# Patient Record
Sex: Female | Born: 2000 | Race: Black or African American | Hispanic: No | Smoking: Never smoker
Health system: Southern US, Community
[De-identification: ages and names within clinical notes are randomized; demographics above are authoritative.]

## PROBLEM LIST (undated history)

## (undated) DIAGNOSIS — G373 Acute transverse myelitis in demyelinating disease of central nervous system: Secondary | ICD-10-CM

## (undated) HISTORY — PX: HERNIA REPAIR: SHX51

---
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
## 2022-01-09 ENCOUNTER — Encounter: Payer: Self-pay | Admitting: Nurse Practitioner

## 2022-01-09 ENCOUNTER — Ambulatory Visit (INDEPENDENT_AMBULATORY_CARE_PROVIDER_SITE_OTHER): Payer: Medicaid Other | Admitting: Nurse Practitioner

## 2022-01-09 VITALS — BP 108/70 | HR 91 | Temp 98.0°F | Ht 69.75 in | Wt 136.6 lb

## 2022-01-09 DIAGNOSIS — Z3200 Encounter for pregnancy test, result unknown: Secondary | ICD-10-CM | POA: Diagnosis not present

## 2022-01-09 DIAGNOSIS — Z1159 Encounter for screening for other viral diseases: Secondary | ICD-10-CM

## 2022-01-09 DIAGNOSIS — N898 Other specified noninflammatory disorders of vagina: Secondary | ICD-10-CM | POA: Diagnosis not present

## 2022-01-09 DIAGNOSIS — Z136 Encounter for screening for cardiovascular disorders: Secondary | ICD-10-CM

## 2022-01-09 DIAGNOSIS — Z13220 Encounter for screening for lipoid disorders: Secondary | ICD-10-CM | POA: Diagnosis not present

## 2022-01-09 DIAGNOSIS — Z113 Encounter for screening for infections with a predominantly sexual mode of transmission: Secondary | ICD-10-CM | POA: Insufficient documentation

## 2022-01-09 DIAGNOSIS — Z7689 Persons encountering health services in other specified circumstances: Secondary | ICD-10-CM

## 2022-01-09 LAB — PREGNANCY, URINE: Preg Test, Ur: NEGATIVE

## 2022-01-09 LAB — WET PREP FOR TRICH, YEAST, CLUE
Clue Cell Exam: POSITIVE — AB
Trichomonas Exam: NEGATIVE
Yeast Exam: NEGATIVE

## 2022-01-09 LAB — URINALYSIS, ROUTINE W REFLEX MICROSCOPIC
Bilirubin, UA: NEGATIVE
Glucose, UA: NEGATIVE
Leukocytes,UA: NEGATIVE
Nitrite, UA: NEGATIVE
Protein,UA: NEGATIVE
Specific Gravity, UA: >1.03 — ABNORMAL HIGH (ref 1.005–1.030)
Urobilinogen, Ur: 0.2 mg/dL (ref 0.2–1.0)
pH, UA: 5.5 (ref 5.0–7.5)

## 2022-01-09 LAB — MICROSCOPIC EXAMINATION: WBC, UA: NONE SEEN /hpf (ref 0–5)

## 2022-01-09 MED ORDER — METRONIDAZOLE 500 MG PO TABS: 500.0000 mg | ORAL_TABLET | Freq: Two times a day (BID) | ORAL | 0 refills | Status: AC

## 2022-01-09 NOTE — Progress Notes (Signed)
New Patient Office Visit  Subjective    Patient ID: Brandi [name], female    DOB: Feb 27, 2001  Age: 21 y.o. MRN: [id number]  CC:  Chief Complaint  Patient presents with   Establish Care   Menstrual Problem    Patient states she was 13 days late for her menstrual cycle, started spotting yesterday but states it is very light. Patient states she had vaginal discharge with an odor, but that has stopped. Patient states there is a possibility she may be pregnant and would like screening for STI's done.     HPI Brandi [name] presents for new patient visit to establish care.  Introduced to Designer, jewellery role and practice setting.  All questions answered.  Discussed provider/patient relationship and expectations.  STD SCREENING & POSSIBLE PREGNANCY Currently sexually active with one partner - has been with them on and off.  No current birth control, including condoms.  LMP 11/24/21.  Is currently 13 days late, but did have some recent spotting.  She is a G 0 P 0.   Sexual activity:  In a Monogamous Relationship Contraception: no Recent unprotected intercourse: yes History of sexually transmitted diseases: yes -- chlamydia Previous sexually transmitted disease screening: yes Lifetime sexual partners: 3 Genital lesions: no Dysuria: no Swollen lymph nodes: no Fevers: no Rash: no  PREGNANCY POSSIBLE In past used Depo and did not like loss of appetite and then tried birth control and this made her feel sad. Gravida/Para: 0/0 Home pregnancy test: negative x1 Nausea: yes Vomiting: no Breast tenderness: no Abdominal pain: mild cramps Vaginal bleeding:  spotting Pregnancy or labor complications: no Fetal abnormalities: no Contraception: none  Outpatient Encounter Medications as of 01/09/2022  Medication Sig   metroNIDAZOLE (FLAGYL) 500 MG tablet Take 1 tablet (500 mg total) by mouth 2 (two) times daily for 7 days.   [DISCONTINUED] benzonatate (TESSALON PERLES)  100 MG capsule Take 1-2 tabs TID prn cough (Patient not taking: Reported on 01/09/2022)   [DISCONTINUED] brompheniramine-pseudoephedrine-DM 30-2-10 MG/5ML syrup Take 5 mLs by mouth 4 (four) times daily as needed. (Patient not taking: Reported on 01/09/2022)   [DISCONTINUED] ondansetron (ZOFRAN-ODT) 4 MG disintegrating tablet Take 1 tablet (4 mg total) by mouth every 8 (eight) hours as needed for nausea or vomiting. (Patient not taking: Reported on 01/09/2022)   No facility-administered encounter medications on file as of 01/09/2022.    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 No pertinent family history.  Social History   Socioeconomic History   Marital status: Single    Spouse name: Not on file   Number of children: Not on file   Years of education: Not on file   Highest education level: Not on file  Occupational History   Not on file  Tobacco Use   Smoking status: Never   Smokeless tobacco: Never  Vaping Use   Vaping Use: Former  Substance and Sexual Activity   Alcohol use: Yes    Comment: special ocassions   Drug use: Yes    Types: Marijuana   Sexual activity: Yes  Other Topics Concern   Not on file  Social History Narrative   Not on file   Social Determinants of Health   Financial Resource Strain: Low Risk  (01/09/2022)   Overall Financial Resource Strain (CARDIA)    Difficulty of Paying Living  Expenses: Not hard at all  Food Insecurity: No Food Insecurity (01/09/2022)   Hunger Vital Sign    Worried About Running Out of Food in the Last Year: Never true    Ran Out of Food in the Last Year: Never true  Transportation Needs: No Transportation Needs (01/09/2022)   PRAPARE - Administrator, Civil Service (Medical): No    Lack of Transportation (Non-Medical): No  Physical Activity: Inactive (01/09/2022)   Exercise Vital Sign    Days of  Exercise per Week: 0 days    Minutes of Exercise per Session: 0 min  Stress: No Stress Concern Present (01/09/2022)   [employer] of Occupational Health - Occupational Stress Questionnaire    Feeling of Stress : Only a little  Social Connections: Socially Isolated (01/09/2022)   Social Connection and Isolation Panel [NHANES]    Frequency of Communication with Friends and Family: More than three times a week    Frequency of Social Gatherings with Friends and Family: More than three times a week    Attends Religious Services: Never    Database administrator or Organizations: No    Attends Banker Meetings: Never    Marital Status: Never married  Intimate Partner Violence: Not At Risk (01/09/2022)   Humiliation, Afraid, Rape, and Kick questionnaire    Fear of Current or Ex-Partner: No    Emotionally Abused: No    Physically Abused: No    Sexually Abused: No    Review of Systems  Constitutional:  Negative for chills, diaphoresis, fever and weight loss.  Respiratory:  Negative for cough, shortness of breath and wheezing.   Cardiovascular:  Negative for chest pain, palpitations, orthopnea and leg swelling.  Gastrointestinal: Negative.   Neurological: Negative.   Psychiatric/Behavioral: Negative.       Objective    BP 108/70   Pulse 91   Temp 98 F (36.7 C)   Ht 5' 9.75" (1.772 m)   Wt 136 lb 9.6 oz (62 kg)   SpO2 98%   BMI 19.74 kg/m   Physical Exam Vitals and nursing note reviewed.  Constitutional:      General: She is awake. She is not in acute distress.    Appearance: She is well-developed, well-groomed and underweight. She is not ill-appearing or toxic-appearing.  HENT:     Head: Normocephalic.     Right Ear: Hearing normal.     Left Ear: Hearing normal.     Nose: Nose normal.     Mouth/Throat:     Mouth: Mucous membranes are moist.  Eyes:     General: Lids are normal.        Right eye: No discharge.        Left eye: No discharge.      Conjunctiva/sclera: Conjunctivae normal.     Pupils: Pupils are equal, round, and reactive to light.  Neck:     Thyroid: No thyromegaly.     Vascular: No carotid bruit or JVD.  Cardiovascular:     Rate and Rhythm: Normal rate and regular rhythm.     Heart sounds: Normal heart sounds. No murmur heard.    No gallop.  Pulmonary:     Effort: Pulmonary effort is normal.     Breath sounds: Normal breath sounds.  Abdominal:     General: Bowel sounds are normal.     Palpations: Abdomen is soft. There is no hepatomegaly or splenomegaly.  Musculoskeletal:     Cervical back: Normal range  of motion and neck supple.     Right lower leg: No edema.     Left lower leg: No edema.  Lymphadenopathy:     Cervical: No cervical adenopathy.  Skin:    General: Skin is warm and dry.  Neurological:     Mental Status: She is alert and oriented to person, place, and time.  Psychiatric:        Attention and Perception: Attention normal.        Mood and Affect: Mood normal.        Behavior: Behavior normal. Behavior is cooperative.        Thought Content: Thought content normal.        Judgment: Judgment normal.    Last CBC Lab Results  Component Value Date   WBC 2.5 (L) 03/14/2021   HGB 13.2 03/14/2021   HCT 38.2 03/14/2021   MCV 82.0 03/14/2021   MCH 28.3 03/14/2021   RDW 13.4 03/14/2021   PLT 150 03/14/2021   Last metabolic panel Lab Results  Component Value Date   GLUCOSE 102 (H) 03/14/2021   NA 138 03/14/2021   K 4.1 03/14/2021   CL 105 03/14/2021   CO2 26 03/14/2021   BUN 7 03/14/2021   CREATININE 0.78 03/14/2021   GFRNONAA NOT CALCULATED 03/14/2021   CALCIUM 9.2 03/14/2021   PROT 7.5 03/14/2021   ALBUMIN 4.6 03/14/2021   BILITOT 1.3 (H) 03/14/2021   ALKPHOS 66 03/14/2021   AST 28 03/14/2021   ALT 21 03/14/2021   ANIONGAP 7 03/14/2021    Assessment & Plan:   Problem List Items Addressed This Visit       Other   Possible pregnancy - Primary    Cycle is 13 days late,  with some spotting.  Urine pregnancy is negative, will send blood testing too.  She is not using any birth control, even condoms.  Highly recommend Nexplanon + use of condoms regularly.      Relevant Orders   Pregnancy, urine   Beta hCG quant (ref lab)   CBC with Differential/Platelet   Comprehensive metabolic panel   TSH   Screen for STD (sexually transmitted disease)    Currently in monogamous relationship with no protection use.  Labs: RPR, HIV, Chlam/Gon, Hep C, HSV.      Relevant Orders   GC/Chlamydia Probe Amp   HIV Antibody (routine testing w rflx)   HSV(herpes simplex vrs) 1+2 ab-IgG   RPR   Vaginal discharge    New over past days with wet prep noting clue cells, educated her on this finding and treatment.  Send in Flagyl BID for 7 days.  No alcohol use while taking this.      Relevant Orders   Urinalysis, Routine w reflex microscopic   WET PREP FOR TRICH, YEAST, CLUE   Other Visit Diagnoses     Need for hepatitis C screening test       Hep C screening on labs today.   Relevant Orders   Hepatitis C antibody   Encounter for lipid screening for cardiovascular disease       Lipid panel on labs today.   Relevant Orders   Lipid Panel w/o Chol/HDL Ratio   Encounter to establish care       Establishing care today, educated on NP role and office setting.       Return in about 1 year (around 01/10/2023) for Annual physical.   Marjie Skiff, NP

## 2022-01-09 NOTE — Assessment & Plan Note (Signed)
New over past days with wet prep noting clue cells, educated her on this finding and treatment.  Send in Flagyl BID for 7 days.  No alcohol use while taking this.

## 2022-01-09 NOTE — Assessment & Plan Note (Signed)
Cycle is 13 days late, with some spotting.  Urine pregnancy is negative, will send blood testing too.  She is not using any birth control, even condoms.  Highly recommend Nexplanon + use of condoms regularly.

## 2022-01-09 NOTE — Assessment & Plan Note (Signed)
Currently in monogamous relationship with no protection use.  Labs: RPR, HIV, Chlam/Gon, Hep C, HSV.

## 2022-01-10 LAB — COMPREHENSIVE METABOLIC PANEL
ALT: 8 IU/L (ref 0–32)
AST: 15 IU/L (ref 0–40)
Albumin/Globulin Ratio: 2.1 (ref 1.2–2.2)
Albumin: 4.9 g/dL (ref 4.0–5.0)
Alkaline Phosphatase: 65 IU/L (ref 42–106)
BUN/Creatinine Ratio: 11 (ref 9–23)
BUN: 9 mg/dL (ref 6–20)
Bilirubin Total: 1.6 mg/dL — ABNORMAL HIGH (ref 0.0–1.2)
CO2: 17 mmol/L — ABNORMAL LOW (ref 20–29)
Calcium: 9.8 mg/dL (ref 8.7–10.2)
Chloride: 105 mmol/L (ref 96–106)
Creatinine, Ser: 0.84 mg/dL (ref 0.57–1.00)
Globulin, Total: 2.3 g/dL (ref 1.5–4.5)
Glucose: 86 mg/dL (ref 70–99)
Potassium: 3.8 mmol/L (ref 3.5–5.2)
Sodium: 141 mmol/L (ref 134–144)
Total Protein: 7.2 g/dL (ref 6.0–8.5)
eGFR: 103 mL/min/{1.73_m2} (ref >59)

## 2022-01-10 LAB — CBC WITH DIFFERENTIAL/PLATELET
Basophils Absolute: 0.1 10*3/uL (ref 0.0–0.2)
Basos: 1 %
EOS (ABSOLUTE): 0.2 10*3/uL (ref 0.0–0.4)
Eos: 5 %
Hematocrit: 38.7 % (ref 34.0–46.6)
Hemoglobin: 13 g/dL (ref 11.1–15.9)
Immature Grans (Abs): 0 10*3/uL (ref 0.0–0.1)
Immature Granulocytes: 0 %
Lymphocytes Absolute: 1.9 10*3/uL (ref 0.7–3.1)
Lymphs: 47 %
MCH: 26.6 pg (ref 26.6–33.0)
MCHC: 33.6 g/dL (ref 31.5–35.7)
MCV: 79 fL (ref 79–97)
Monocytes Absolute: 0.3 10*3/uL (ref 0.1–0.9)
Monocytes: 7 %
Neutrophils Absolute: 1.6 10*3/uL (ref 1.4–7.0)
Neutrophils: 40 %
Platelets: 223 10*3/uL (ref 150–450)
RBC: 4.88 x10E6/uL (ref 3.77–5.28)
RDW: 13.1 % (ref 11.7–15.4)
WBC: 4 10*3/uL (ref 3.4–10.8)

## 2022-01-10 LAB — LIPID PANEL W/O CHOL/HDL RATIO
Cholesterol, Total: 136 mg/dL (ref 100–169)
HDL: 63 mg/dL (ref >39)
LDL Chol Calc (NIH): 63 mg/dL (ref 0–109)
Triglycerides: 42 mg/dL (ref 0–89)
VLDL Cholesterol Cal: 10 mg/dL (ref 5–40)

## 2022-01-10 LAB — HSV(HERPES SIMPLEX VRS) I + II AB-IGG
HSV 1 Glycoprotein G Ab, IgG: <0.91 index (ref 0.00–0.90)
HSV 2 IgG, Type Spec: 8.76 index — ABNORMAL HIGH (ref 0.00–0.90)

## 2022-01-10 LAB — HEPATITIS C ANTIBODY: Hep C Virus Ab: NONREACTIVE

## 2022-01-10 LAB — HIV ANTIBODY (ROUTINE TESTING W REFLEX): HIV Screen 4th Generation wRfx: NONREACTIVE

## 2022-01-10 LAB — BETA HCG QUANT (REF LAB): hCG Quant: <1 m[IU]/mL

## 2022-01-10 LAB — TSH: TSH: 1.27 u[IU]/mL (ref 0.450–4.500)

## 2022-01-10 LAB — RPR: RPR Ser Ql: NONREACTIVE

## 2022-01-10 NOTE — Progress Notes (Signed)
Contacted via MyChart   Good evening Brandi [name], your labs are returning -- still waiting on urine gonorrhea and chlamydia testing.  Current labs: - Blood pregnancy testing is negative, your cycle should return to normal but I highly recommend using protection with intercourse. - HIV, Hep C, and syphilis testing is negative. - CBC shows no anemia or infection present. - Cholesterol labs are fantastic and at goal + thyroid is normal (TSH). - Kidney function, creatinine and eGFR, remains normal, as is liver function, AST and ALT.   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  You may not have had an outbreak, but the virus can still be present.  You are not able to pass this on unless symptoms present or you have an active outbreak.  I do recommend using condoms during all sexually activity.  I will provide link to CDC site for more information.  We do not treat unless you have outbreak in future.  Any questions? Keep being amazing!!  Thank you for allowing me to participate in your care.  I appreciate you. Kindest regards, Ilene Witcher  [email or web address]

## 2022-01-11 ENCOUNTER — Other Ambulatory Visit: Payer: Self-pay | Admitting: Nurse Practitioner

## 2022-01-11 DIAGNOSIS — A749 Chlamydial infection, unspecified: Secondary | ICD-10-CM | POA: Insufficient documentation

## 2022-01-11 LAB — GC/CHLAMYDIA PROBE AMP
Chlamydia trachomatis, NAA: POSITIVE — AB
Neisseria Gonorrhoeae by PCR: NEGATIVE

## 2022-01-11 MED ORDER — DOXYCYCLINE HYCLATE 100 MG PO TABS: 100.0000 mg | ORAL_TABLET | Freq: Two times a day (BID) | ORAL | 0 refills | Status: AC

## 2022-01-11 NOTE — Progress Notes (Signed)
Good morning Tyshawna, your gonorrhea testing has returned negative, but chlamydia testing has not returned yet.  I will alert you when this does return.  Any questions?

## 2022-01-11 NOTE — Progress Notes (Signed)
Contacted via MyChart -- admin crew she will need a 3 month visit scheduled with me for visit and retest.  Destiny please call patient and make sure she received message below + I will need you to report this to health department.   Good afternoon Brandi [name], your chlamydia testing did return positive.  I am sending in Doxycycline to take for 7 days and we will need to retest you in 3 months.  Please ensure to alert your partner as they will need treatment too and I highly recommend use of protection during intercourse, any questions?

## 2022-03-23 ENCOUNTER — Encounter: Payer: Self-pay | Admitting: Nurse Practitioner

## 2022-03-23 DIAGNOSIS — N926 Irregular menstruation, unspecified: Secondary | ICD-10-CM

## 2022-04-10 ENCOUNTER — Encounter: Payer: Self-pay | Admitting: Nurse Practitioner

## 2022-04-10 NOTE — Telephone Encounter (Signed)
Called patient to schedule an appointment with any provider.

## 2022-04-11 NOTE — Telephone Encounter (Signed)
Pt scheduled with Erin on Monday 

## 2022-04-14 ENCOUNTER — Encounter: Payer: Self-pay | Admitting: Physician Assistant

## 2022-04-14 ENCOUNTER — Ambulatory Visit (INDEPENDENT_AMBULATORY_CARE_PROVIDER_SITE_OTHER): Payer: Medicaid Other | Admitting: Physician Assistant

## 2022-04-14 VITALS — BP 109/68 | HR 73 | Temp 98.3°F | Ht 69.76 in | Wt 134.8 lb

## 2022-04-14 DIAGNOSIS — Z3200 Encounter for pregnancy test, result unknown: Secondary | ICD-10-CM

## 2022-04-14 LAB — PREGNANCY, URINE: Preg Test, Ur: NEGATIVE

## 2022-04-14 NOTE — Progress Notes (Signed)
Acute Office Visit   Patient: Brandi [name]   DOB: 08-21-2000   20 y.o. Female  MRN: [id number] Visit Date: 04/14/2022  Today's healthcare provider: Oswaldo Conroy Red Mandt, PA-C  Introduced myself to the patient as a Secondary school teacher and provided education on APPs in clinical practice.    Chief Complaint  Patient presents with   Bloated    Nausea for the past 2 week, more in the middle of day and when she gets up.  burping a lot has been going on for a little minute and menses is 28 days late.    Subjective    HPI HPI     Bloated    Additional comments: Nausea for the past 2 week, more in the middle of day and when she gets up.  burping a lot has been going on for a little minute and menses is 28 days late.       Last edited by Sherolyn Buba, CMA on 04/14/2022 10:57 AM.       Concern for late menses (28 days late)  LMP:  Aug 16- 20th Usually she has regular periods Spotting: no  Sexual activity: she is sexually active, last encounter was Sat Previous contraceptive use: Depo and OCP but did not like side effects  She is fluctuating with interest in birth control. She is not using condoms She declines concerns for STDs today and declines testing  Stress: no  Associated with bloating, nausea, mild cramping  She denies pelvic pain or pain with sex - but reports increased sensitivity in vaginal area  Negative pregnancy test today in office  She has received a text stating that the OB/GYN office will give her call to schedule apt     Medications: No outpatient medications prior to visit.   No facility-administered medications prior to visit.    Review of Systems  Genitourinary:  Positive for menstrual problem. Negative for vaginal bleeding, vaginal discharge and vaginal pain.       Objective    BP 109/68   Pulse 73   Temp 98.3 F (36.8 C) (Oral)   Ht 5' 9.76" (1.772 m)   Wt 134 lb 12.8 oz (61.1 kg)   SpO2 100%   BMI 19.47 kg/m    Physical  Exam Vitals reviewed.  Constitutional:      General: She is awake.     Appearance: Normal appearance. She is well-developed and well-groomed.  HENT:     Head: Normocephalic and atraumatic.  Eyes:     General: Lids are normal. Gaze aligned appropriately.     Extraocular Movements: Extraocular movements intact.     Conjunctiva/sclera: Conjunctivae normal.  Pulmonary:     Effort: Pulmonary effort is normal.  Musculoskeletal:     Cervical back: Normal range of motion.  Neurological:     General: No focal deficit present.     Mental Status: She is alert and oriented to person, place, and time.     GCS: GCS eye subscore is 4. GCS verbal subscore is 5. GCS motor subscore is 6.     Cranial Nerves: No dysarthria or facial asymmetry.  Psychiatric:        Attention and Perception: Attention and perception normal.        Mood and Affect: Mood and affect normal.        Speech: Speech normal.        Behavior: Behavior normal. Behavior is cooperative.  Results for orders placed or performed in visit on 04/14/22  Pregnancy, urine  Result Value Ref Range   Preg Test, Ur Negative Negative    Assessment & Plan      No follow-ups on file.       Problem List Items Addressed This Visit       Other   Possible pregnancy - Primary    Acute, recurrent  Patient states she about 28 days late for cycle and is having bloating, nausea and light cramping In-office urine pregnancy test was negative, will send for hcg quantitative for definitive result She is not using birth control and attempted to discuss IUD or Nexplanon with her - she seems hesitant to proceed with these  Reviewed referral to Ob/Gyn - authorized,  but she has not received call to schedule so I provided her with the office information so she can call.  Recommend she gets set up with Ob/Gyn to discuss birth control options, fertility concerns and any recommended testing/ imaging Reviewed that late cycle could be from  several things - mildly concerned for early miscarriage but could also be irregular menses- lab results to further guide management        Relevant Orders   Pregnancy, urine (Completed)   Beta hCG quant (ref lab)     No follow-ups on file.   I, Blayne Frankie E Addalee Kavanagh, PA-C, have reviewed all documentation for this visit. The documentation on 04/14/22 for the exam, diagnosis, procedures, and orders are all accurate and complete.   Talitha Givens, MHS, PA-C [facility]

## 2022-04-14 NOTE — Patient Instructions (Addendum)
You have been referred to Ob/Gyn with Benjaman Kindler, MD  The information for that office is below, please call them and see if you can schedule an apt:  [address] [place], [place]  [address] [phone]

## 2022-04-14 NOTE — Assessment & Plan Note (Signed)
Acute, recurrent  Patient states she about 28 days late for cycle and is having bloating, nausea and light cramping In-office urine pregnancy test was negative, will send for hcg quantitative for definitive result She is not using birth control and attempted to discuss IUD or Nexplanon with her - she seems hesitant to proceed with these  Reviewed referral to Ob/Gyn - authorized,  but she has not received call to schedule so I provided her with the office information so she can call.  Recommend she gets set up with Ob/Gyn to discuss birth control options, fertility concerns and any recommended testing/ imaging Reviewed that late cycle could be from several things - mildly concerned for early miscarriage but could also be irregular menses- lab results to further guide management

## 2022-04-15 LAB — BETA HCG QUANT (REF LAB): hCG Quant: <1 m[IU]/mL

## 2022-04-18 ENCOUNTER — Encounter: Payer: Self-pay | Admitting: Nurse Practitioner

## 2022-04-20 NOTE — Patient Instructions (Incomplete)
Chlamydia, Female Chlamydia is a sexually transmitted infection (STI). This infection spreads through sexual contact. The infection can grow in: The urethra. This is the part of the body that drains pee (urine) from the bladder. The cervix. This is the lowest part of the womb (uterus). The throat. The opening of the butt (rectum). This condition is not hard to treat. But if it is not treated, it can cause worse health problems. You may have a higher risk of not being able to have children. Also, if you are pregnant or get pregnant and have untreated chlamydia: It can cause serious problems during pregnancy. It can spread to your baby during delivery and cause your baby to have health problems. What are the causes?  This condition is caused by a germ (bacteria) called Chlamydia trachomatis. These germs are spread from an infected partner during sex. The infection can spread through contact with the genitals, mouth, or the opening of the butt (rectum). What increases the risk? Not using a condom the right way. Not using a condom every time you have sex. Having a new sex partner. Having more than one sex partner. Being sexually active before age 25. What are the signs or symptoms? In some cases, there are no symptoms, especially early in the illness. If you get symptoms, they may include: Peeing often, or a burning feeling when you pee. Redness, soreness, or swelling of the vagina or butt. Fluid (discharge) coming from the vagina or butt. Pain the belly (abdomen). Pain during sex. Bleeding between monthly periods or irregular periods. How is this treated? This condition is treated with antibiotic medicines. Follow these instructions at home: Sexual activity Tell your sex partner or partners about your infection. Sex partners are people you had oral, anal, or vaginal sex with within 60 days of when you started getting sick. They need treatment even if they do not feel or seem sick. Do  not have sex until: You and your sex partners have been treated. Your doctor says it is okay. If you get just one dose of medicine, wait at least 7 days before having sex. General instructions Take over-the-counter and prescription medicines as told by your doctor. Finish your antibiotics even if you start to feel better. It is up to you to get your test results. Ask how to get your results when they are ready. Keep all follow-up visits. You may need tests after 3 months. How is this prevented? To lower your risk: Use latex or polyurethane condoms the right way. Do this every time you have sex. Do not have many sex partners. Ask if your sex partner got tested for STIs and had negative results. Get regular health screenings to check for STIs. Contact a doctor if: You get new symptoms. Your symptoms are getting worse or do not get better with treatment. You have a fever or chills. You have pain during sex. Your periods are irregular. You bleed between periods or after sex. You get flu-like symptoms. These may be: Night sweats. Sore throat. Muscle aches. You are unable to take your antibiotic medicine as prescribed. Summary Chlamydia is an infection that spreads through sexual contact. This condition is treated with antibiotics. If it is not treated, it can cause health problems. Your sex partners will also need to be treated. Do not have sex until both you and your partner have been treated. Take all medicines as told and keep all follow-up visits. This information is not intended to replace advice given to you   by your health care provider. Make sure you discuss any questions you have with your health care provider. Document Revised: 03/25/2021 Document Reviewed: 03/25/2021 Elsevier Patient Education  2023 Elsevier Inc.  

## 2022-04-21 ENCOUNTER — Ambulatory Visit: Payer: Medicaid Other | Admitting: Nurse Practitioner

## 2022-04-21 DIAGNOSIS — A749 Chlamydial infection, unspecified: Secondary | ICD-10-CM

## 2022-04-24 ENCOUNTER — Ambulatory Visit: Payer: Medicaid Other | Admitting: Nurse Practitioner

## 2022-04-24 DIAGNOSIS — A749 Chlamydial infection, unspecified: Secondary | ICD-10-CM

## 2022-04-24 DIAGNOSIS — R1084 Generalized abdominal pain: Secondary | ICD-10-CM

## 2022-04-24 NOTE — Telephone Encounter (Signed)
Pt came in office to reschedule. She rescheduled to 11/1

## 2022-04-28 NOTE — Patient Instructions (Incomplete)
Chlamydia, Female Chlamydia is a sexually transmitted infection (STI). This infection spreads through sexual contact. The infection can grow in: The urethra. This is the part of the body that drains pee (urine) from the bladder. The cervix. This is the lowest part of the womb (uterus). The throat. The opening of the butt (rectum). This condition is not hard to treat. But if it is not treated, it can cause worse health problems. You may have a higher risk of not being able to have children. Also, if you are pregnant or get pregnant and have untreated chlamydia: It can cause serious problems during pregnancy. It can spread to your baby during delivery and cause your baby to have health problems. What are the causes?  This condition is caused by a germ (bacteria) called Chlamydia trachomatis. These germs are spread from an infected partner during sex. The infection can spread through contact with the genitals, mouth, or the opening of the butt (rectum). What increases the risk? Not using a condom the right way. Not using a condom every time you have sex. Having a new sex partner. Having more than one sex partner. Being sexually active before age 25. What are the signs or symptoms? In some cases, there are no symptoms, especially early in the illness. If you get symptoms, they may include: Peeing often, or a burning feeling when you pee. Redness, soreness, or swelling of the vagina or butt. Fluid (discharge) coming from the vagina or butt. Pain the belly (abdomen). Pain during sex. Bleeding between monthly periods or irregular periods. How is this treated? This condition is treated with antibiotic medicines. Follow these instructions at home: Sexual activity Tell your sex partner or partners about your infection. Sex partners are people you had oral, anal, or vaginal sex with within 60 days of when you started getting sick. They need treatment even if they do not feel or seem sick. Do  not have sex until: You and your sex partners have been treated. Your doctor says it is okay. If you get just one dose of medicine, wait at least 7 days before having sex. General instructions Take over-the-counter and prescription medicines as told by your doctor. Finish your antibiotics even if you start to feel better. It is up to you to get your test results. Ask how to get your results when they are ready. Keep all follow-up visits. You may need tests after 3 months. How is this prevented? To lower your risk: Use latex or polyurethane condoms the right way. Do this every time you have sex. Do not have many sex partners. Ask if your sex partner got tested for STIs and had negative results. Get regular health screenings to check for STIs. Contact a doctor if: You get new symptoms. Your symptoms are getting worse or do not get better with treatment. You have a fever or chills. You have pain during sex. Your periods are irregular. You bleed between periods or after sex. You get flu-like symptoms. These may be: Night sweats. Sore throat. Muscle aches. You are unable to take your antibiotic medicine as prescribed. Summary Chlamydia is an infection that spreads through sexual contact. This condition is treated with antibiotics. If it is not treated, it can cause health problems. Your sex partners will also need to be treated. Do not have sex until both you and your partner have been treated. Take all medicines as told and keep all follow-up visits. This information is not intended to replace advice given to you   by your health care provider. Make sure you discuss any questions you have with your health care provider. Document Revised: 03/25/2021 Document Reviewed: 03/25/2021 Elsevier Patient Education  2023 Elsevier Inc.  

## 2022-04-30 ENCOUNTER — Ambulatory Visit: Payer: Medicaid Other | Admitting: Nurse Practitioner

## 2022-04-30 DIAGNOSIS — R1084 Generalized abdominal pain: Secondary | ICD-10-CM

## 2022-04-30 DIAGNOSIS — A749 Chlamydial infection, unspecified: Secondary | ICD-10-CM

## 2022-05-02 ENCOUNTER — Ambulatory Visit: Payer: Medicaid Other | Admitting: Family Medicine

## 2022-05-02 ENCOUNTER — Encounter: Payer: Self-pay | Admitting: Physician Assistant

## 2022-05-02 ENCOUNTER — Ambulatory Visit (INDEPENDENT_AMBULATORY_CARE_PROVIDER_SITE_OTHER): Payer: Medicaid Other | Admitting: Physician Assistant

## 2022-05-02 VITALS — BP 104/68 | HR 94 | Temp 98.9°F | Wt 133.9 lb

## 2022-05-02 DIAGNOSIS — N644 Mastodynia: Secondary | ICD-10-CM | POA: Insufficient documentation

## 2022-05-02 DIAGNOSIS — A749 Chlamydial infection, unspecified: Secondary | ICD-10-CM | POA: Diagnosis not present

## 2022-05-02 DIAGNOSIS — N911 Secondary amenorrhea: Secondary | ICD-10-CM | POA: Diagnosis not present

## 2022-05-02 NOTE — Progress Notes (Signed)
Established Patient Office Visit  Name: Brandi [name]   MRN: [id number]    DOB: 2001-03-05   Date:05/02/2022  Today's Provider: Jacquelin Hawking, MHS, PA-C Introduced myself to the patient as a PA-C and provided education on APPs in clinical practice.         Subjective  Chief Complaint  Chief Complaint  Patient presents with   Amenorrhea    Patient says she has not had a cycle in 43 days. Patient is concerned as she still experiences PMS such as breast tenderness that will last for a week. Patient says she was seen by OB-GYN today and had her urine tested for pregnancy and test was negative.    HPI   Secondary Amenorrhea Reports she is now at 43 days without her period Reports breast tenderness and cramping that is intermittent Denies spotting, changes to vaginal discharge, pain with sex  She states she is interested in getting nexplanon to help with contraception She had a negative urine pregnancy test today at OB/Gyn  She reports she was only offered the UPT and nothing else today at her apt  She states they did not do blood work at OB/GYN     Patient Active Problem List   Diagnosis Date Noted   Breast tenderness in female 05/02/2022   Secondary amenorrhea 05/02/2022   Chlamydia infection 01/11/2022   Possible pregnancy 01/09/2022   Neurogenic bowel 11/07/2017    Past Surgical History:  Procedure Laterality Date   HERNIA REPAIR      History reviewed. No pertinent family history.  Social History   Tobacco Use   Smoking status: Never   Smokeless tobacco: Never  Substance Use Topics   Alcohol use: Yes    Comment: special ocassions    No current outpatient medications on file.  Allergies  Allergen Reactions   Plum Pulp Hives    Oral reaction    Apple Juice     Patient states she is allergic to any apple products   Pork Allergy     I personally reviewed active problem list, medication list, allergies, health maintenance, notes from  last encounter, notes from last 2 encounters, lab results with the patient/caregiver today.   Review of Systems  Genitourinary:        Amenorrhea      Objective  Vitals:   05/02/22 1451  BP: 104/68  Pulse: 94  Temp: 98.9 F (37.2 C)  TempSrc: Oral  SpO2: 100%  Weight: 133 lb 14.4 oz (60.7 kg)    Body mass index is 19.34 kg/m.  Physical Exam Vitals reviewed.  Constitutional:      General: She is awake.     Appearance: Normal appearance. She is well-developed, well-groomed and normal weight.  HENT:     Head: Normocephalic and atraumatic.  Pulmonary:     Effort: Pulmonary effort is normal.  Neurological:     General: No focal deficit present.     Mental Status: She is alert and oriented to person, place, and time. Mental status is at baseline.  Psychiatric:        Mood and Affect: Mood normal.        Behavior: Behavior normal. Behavior is cooperative.        Thought Content: Thought content normal.        Judgment: Judgment normal.      Recent Results (from the past 2160 hour(s))  Pregnancy, urine     Status: None  Collection Time: 04/14/22 11:06 AM  Result Value Ref Range   Preg Test, Ur Negative Negative  Beta hCG quant (ref lab)     Status: None   Collection Time: 04/14/22 11:33 AM  Result Value Ref Range   hCG Quant <1 mIU/mL    Comment:                      Female (Non-pregnant)    0 -     5                             (Postmenopausal)  0 -     8                      Female (Pregnant)                      Weeks of Gestation                              3                6 -    71                              4               10 -   750                              5              217 -  7138                              6              158 - 31795                              7             [id number]                              8            [id number]                              9            [phone] -[id number] [id number]  14            13950 - 62530                             15            12039 - 70971                             16             9040 - 56451                             17             [phone] - [phone] Roche E CLIA methodology      PHQ2/9:    04/14/2022   11:03 AM 01/09/2022    9:10 AM  Depression screen PHQ 2/9  Decreased Interest 0 1  Down, Depressed, Hopeless 0 0  PHQ - 2 Score 0 1  Altered sleeping 3 3  Tired, decreased energy 1 1  Change in appetite 0 0  Feeling bad or failure about yourself  0 0  Trouble concentrating 0 0  Moving slowly or fidgety/restless 0 0  Suicidal thoughts 0 0  PHQ-9 Score 4 5  Difficult doing work/chores Not difficult at all Not difficult at all      Fall Risk:    04/14/2022   11:03 AM 01/09/2022    9:10 AM  Fall Risk   Falls in the past year? 0 0  Number falls in past yr: 0 0  Injury with Fall? 0 0  Risk for fall due to : No Fall Risks No Fall Risks  Follow up Falls evaluation completed Falls evaluation completed      Functional Status Survey:      Assessment & Plan  Problem List Items Addressed This Visit       Other   Chlamydia infection    Acute, unsure if resolved or ongoing She was diagnosed and treated for chlamydia in July 2023 but has not completed test of cure since treatment was concluded Will check today with cervicovaginal swab  Results to dictate further management Follow up as needed.       Relevant Orders   Cervicovaginal ancillary only   Secondary amenorrhea - Primary    Acute, ongoing concern She has had several in-office pregnancy tests which have been negative and was seen by Ob/Gyn earlier today with negative preg test in office there Unsure why further testing was not carried out with that provider when note mentioned it was offered. Will order  their tests here to include TSH, Prolactin, LH, FSH, cervicovaginal swab for gonorrhea, chlamydia, trich, BV and yeast for rule out Recommend she keep her upcoming Ob/Gyn apt on 05/15/22 and complete pelvic exam with them and allow them to assist with dx work up - she was amenable to this suggestion and agreed with plan  Will continue collaboration with Ob/Gyn services to provide dx  Encouraged her to pursue birth control options with Ob/gyn as she expressed interest in Nexplanon implant  in office today  Follow up as needed for continued collaboration and guidance.       Relevant Orders   TSH   Prolactin   FSH   LH     No follow-ups on file.   I, Syanne Looney E Aubryanna Nesheim, PA-C, have reviewed all documentation for this visit. The documentation on 05/02/22 for the exam, diagnosis, procedures, and orders are all accurate and complete.   Jacquelin Hawking, MHS, PA-C [facility]

## 2022-05-02 NOTE — Assessment & Plan Note (Addendum)
Acute, ongoing concern She has had several in-office pregnancy tests which have been negative and was seen by Ob/Gyn earlier today with negative preg test in office there Unsure why further testing was not carried out with that provider when note mentioned it was offered. Will order their tests here to include TSH, Prolactin, LH, FSH, cervicovaginal swab for gonorrhea, chlamydia, trich, BV and yeast for rule out Recommend she keep her upcoming Ob/Gyn apt on 05/15/22 and complete pelvic exam with them and allow them to assist with dx work up - she was amenable to this suggestion and agreed with plan  Will continue collaboration with Ob/Gyn services to provide dx  Encouraged her to pursue birth control options with Ob/gyn as she expressed interest in Nexplanon implant in office today  Follow up as needed for continued collaboration and guidance.

## 2022-05-02 NOTE — Assessment & Plan Note (Signed)
Acute, unsure if resolved or ongoing She was diagnosed and treated for chlamydia in July 2023 but has not completed test of cure since treatment was concluded Will check today with cervicovaginal swab  Results to dictate further management Follow up as needed.

## 2022-05-03 LAB — TSH: TSH: 0.437 u[IU]/mL — ABNORMAL LOW (ref 0.450–4.500)

## 2022-05-03 LAB — LUTEINIZING HORMONE: LH: 11.8 m[IU]/mL

## 2022-05-03 LAB — FOLLICLE STIMULATING HORMONE: FSH: 5.8 m[IU]/mL

## 2022-05-03 LAB — PROLACTIN: Prolactin: 21.7 ng/mL (ref 4.8–23.3)

## 2022-05-07 NOTE — Addendum Note (Signed)
Addended by: Jacquelin Hawking on: 05/07/2022 11:43 AM   Modules accepted: Orders

## 2022-05-09 ENCOUNTER — Other Ambulatory Visit: Payer: Medicaid Other

## 2022-06-06 ENCOUNTER — Encounter: Payer: Self-pay | Admitting: Nurse Practitioner

## 2022-06-06 ENCOUNTER — Ambulatory Visit (INDEPENDENT_AMBULATORY_CARE_PROVIDER_SITE_OTHER): Payer: Medicaid Other | Admitting: Nurse Practitioner

## 2022-06-06 VITALS — BP 119/86 | HR 90 | Temp 98.5°F | Ht 69.76 in | Wt 136.8 lb

## 2022-06-06 DIAGNOSIS — R7989 Other specified abnormal findings of blood chemistry: Secondary | ICD-10-CM

## 2022-06-06 DIAGNOSIS — A749 Chlamydial infection, unspecified: Secondary | ICD-10-CM | POA: Diagnosis not present

## 2022-06-06 DIAGNOSIS — Z3009 Encounter for other general counseling and advice on contraception: Secondary | ICD-10-CM | POA: Diagnosis not present

## 2022-06-06 DIAGNOSIS — N898 Other specified noninflammatory disorders of vagina: Secondary | ICD-10-CM

## 2022-06-06 LAB — WET PREP FOR TRICH, YEAST, CLUE
Clue Cell Exam: POSITIVE — AB
Trichomonas Exam: NEGATIVE
Yeast Exam: NEGATIVE

## 2022-06-06 MED ORDER — METRONIDAZOLE 500 MG PO TABS: 500.0000 mg | ORAL_TABLET | Freq: Two times a day (BID) | ORAL | 0 refills | Status: AC

## 2022-06-06 NOTE — Addendum Note (Signed)
Addended by: Aura Dials T on: 06/06/2022 05:32 PM   Modules accepted: Orders

## 2022-06-06 NOTE — Assessment & Plan Note (Signed)
Noted recent labs, recheck today and determine next steps after return of labs.

## 2022-06-06 NOTE — Progress Notes (Signed)
Contacted via MyChart   Good evening Brandi [name], your vaginal swab did return positive for clue cells -- meaning bacterial vaginosis.  We treated you for this before 4 months ago, it is not sexually transmitted.  It is a bacterial infection and treated with Flagyl which I have sent in for you to pharmacy.  Do not drink alcohol with this please.  Any questions?

## 2022-06-06 NOTE — Patient Instructions (Signed)
Etonogestrel Implant What is this medication? ETONOGESTREL (et oh noe JES trel) prevents ovulation and pregnancy. It belongs to a group of medications called contraceptives. This medication is a progestin hormone. This medicine may be used for other purposes; ask your health care provider or pharmacist if you have questions. COMMON BRAND NAME(S): Implanon, Nexplanon What should I tell my care team before I take this medication? They need to know if you have any of these conditions: Abnormal vaginal bleeding Blood clots Blood vessel disease Breast, cervical, endometrial, ovarian, liver, or uterine cancer Diabetes Gallbladder disease Heart disease or recent heart attack High blood pressure High cholesterol or triglycerides Kidney disease Liver disease Migraine headaches Seizures Stroke Tobacco use An unusual or allergic reaction to etonogestrel, other medications, foods, dyes, or preservatives Pregnant or trying to get pregnant Breastfeeding How should I use this medication? This device is inserted just under the skin on the inner side of your upper arm by your care team. Talk to your care team about the use of this medication in children. Special care may be needed. Overdosage: If you think you have taken too much of this medicine contact a poison control center or emergency room at once. NOTE: This medicine is only for you. Do not share this medicine with others. What if I miss a dose? This does not apply. What may interact with this medication? Do not take this medication with any of the following: Amprenavir Fosamprenavir This medication may also interact with the following: Acitretin Aprepitant Armodafinil Bexarotene Bosentan Carbamazepine Certain antivirals for HIV or hepatitis Certain medications for fungal infections, such as fluconazole, ketoconazole, itraconazole, or  voriconazole Cyclosporine Felbamate Griseofulvin Lamotrigine Modafinil Oxcarbazepine Phenobarbital Phenytoin Primidone Rifabutin Rifampin Rifapentine St. John's wort Topiramate This list may not describe all possible interactions. Give your health care provider a list of all the medicines, herbs, non-prescription drugs, or dietary supplements you use. Also tell them if you smoke, drink alcohol, or use illegal drugs. Some items may interact with your medicine. What should I watch for while using this medication? Visit your care team for regular checks on your progress. Using this medication does not protect you or your partner against HIV or other sexually transmitted infections (STIs). You should be able to feel the implant by pressing your fingertips over the skin where it was inserted. Contact your care team if you cannot feel the implant, and use a non-hormonal birth control method (such as condoms) until your care team confirms that the implant is in place. Contact your care team if you think that the implant may have broken or become bent while in your arm. You will receive a user card from your care team after the implant is inserted. The card is a record of the location of the implant in your upper arm and when it should be removed. Keep this card with your health records. What side effects may I notice from receiving this medication? Side effects that you should report to your care team as soon as possible: Allergic reactions--skin rash, itching, hives, swelling of the face, lips, tongue, or throat Blood clot--pain, swelling, or warmth in the leg, shortness of breath, chest pain Gallbladder problems--severe stomach pain, nausea, vomiting, fever Increase in blood pressure Liver injury--right upper belly pain, loss of appetite, nausea, light-colored stool, dark yellow or brown urine, yellowing skin or eyes, unusual weakness or fatigue New or worsening migraines or headaches Pain,  redness, or irritation at injection site Stroke--sudden numbness or weakness of the face, arm,  or leg, trouble speaking, confusion, trouble walking, loss of balance or coordination, dizziness, severe headache, change in vision Unusual vaginal discharge, itching, or odor Worsening mood, feelings of depression Side effects that usually do not require medical attention (report to your care team if they continue or are bothersome): Breast pain or tenderness Dark patches of skin on the face or other sun-exposed areas Irregular menstrual cycles or spotting Nausea Weight gain This list may not describe all possible side effects. Call your doctor for medical advice about side effects. You may report side effects to FDA at 1-800-FDA-1088. Where should I keep my medication? This medication is given in a hospital or clinic and will not be stored at home. NOTE: This sheet is a summary. It may not cover all possible information. If you have questions about this medicine, talk to your doctor, pharmacist, or health care provider.  2023 Elsevier/Gold Standard (2022-01-21 00:00:00)

## 2022-06-06 NOTE — Assessment & Plan Note (Signed)
Acute and ongoing.  Check wet prep today and treat as needed.  Discussed with patient.

## 2022-06-06 NOTE — Progress Notes (Signed)
BP 119/86   Pulse 90   Temp 98.5 F (36.9 C) (Oral)   Ht 5' 9.76" (1.772 m)   Wt 136 lb 12.8 oz (62.1 kg)   SpO2 98%   BMI 19.76 kg/m    Subjective:    Patient ID: Brandi [name], female    DOB: 2000-11-30, 21 y.o.   MRN: [id number]  HPI: Brandi [name] is a 21 y.o. female  Chief Complaint  Patient presents with   Throid    Here thyroid tests, as well as Chlamydia testing   CHLAMYDIA & BIRTH CONTROL DISCUSSION History of + chlamydia on 01/09/22 which was treated, she is in need of retesting.  Has had heavier discharge lately -- used boric acid but still heavier discharge.  She is interested in birth control, has thoughts about Nexplanon.  Has used birth control pills and Depo in past -- pills made her very emotional + Depo made her lose appetite.   Sexual activity:  In a Monogamous Relationship Contraception: no Recent unprotected intercourse: yes History of sexually transmitted diseases: yes Previous sexually transmitted disease screening: yes Lifetime sexual partners: 3 Genital lesions: no Dysuria: no Swollen lymph nodes: no Fevers: no Rash: no   LOW TSH Labs on 05/02/22 with low TSH levels noted.   Fatigue: no Cold intolerance: yes Heat intolerance: no Weight gain: no Weight loss: no Constipation: no Diarrhea/loose stools: no Palpitations: no Lower extremity edema: no Anxiety/depressed mood: no     06/06/2022    3:10 PM 04/14/2022   11:03 AM 01/09/2022    9:10 AM  Depression screen PHQ 2/9  Decreased Interest 1 0 1  Down, Depressed, Hopeless 1 0 0  PHQ - 2 Score 2 0 1  Altered sleeping 0 3 3  Tired, decreased energy 1 1 1   Change in appetite 0 0 0  Feeling bad or failure about yourself  0 0 0  Trouble concentrating 0 0 0  Moving slowly or fidgety/restless 0 0 0  Suicidal thoughts 0 0 0  PHQ-9 Score 3 4 5   Difficult doing work/chores Not difficult at all Not difficult at all Not difficult at all       06/06/2022    3:10 PM  04/14/2022   11:03 AM 01/09/2022    9:10 AM  GAD 7 : Generalized Anxiety Score  Nervous, Anxious, on Edge 2 0 2  Control/stop worrying 0 1 1  Worry too much - different things 1 2 1   Trouble relaxing 0 0 1  Restless 0 0 0  Easily annoyed or irritable 1 2 2   Afraid - awful might happen 0 0 0  Total GAD 7 Score 4 5 7   Anxiety Difficulty Somewhat difficult Not difficult at all Not difficult at all   Relevant past medical, surgical, family and social history reviewed and updated as indicated. Interim medical history since our last visit reviewed. Allergies and medications reviewed and updated.  Review of Systems  Constitutional:  Negative for activity change, appetite change, diaphoresis, fatigue and fever.  Respiratory:  Negative for cough, chest tightness and shortness of breath.   Cardiovascular:  Negative for chest pain, palpitations and leg swelling.  Endocrine: Positive for cold intolerance. Negative for heat intolerance.  Genitourinary:  Positive for vaginal discharge.  Neurological: Negative.   Psychiatric/Behavioral: Negative.      Per HPI unless specifically indicated above     Objective:    BP 119/86   Pulse 90   Temp 98.5 F (  36.9 C) (Oral)   Ht 5' 9.76" (1.772 m)   Wt 136 lb 12.8 oz (62.1 kg)   SpO2 98%   BMI 19.76 kg/m   Wt Readings from Last 3 Encounters:  06/06/22 136 lb 12.8 oz (62.1 kg) (67 %, Z= 0.45)*  05/02/22 133 lb 14.4 oz (60.7 kg) (64 %, Z= 0.35)*  04/14/22 134 lb 12.8 oz (61.1 kg) (65 %, Z= 0.39)*   * Growth percentiles are based on CDC (Girls, 2-20 Years) data.    Physical Exam Vitals and nursing note reviewed.  Constitutional:      General: She is awake. She is not in acute distress.    Appearance: She is well-developed, well-groomed and underweight. She is not ill-appearing or toxic-appearing.  HENT:     Head: Normocephalic.     Right Ear: Hearing normal.     Left Ear: Hearing normal.     Nose: Nose normal.     Mouth/Throat:      Mouth: Mucous membranes are moist.  Eyes:     General: Lids are normal.        Right eye: No discharge.        Left eye: No discharge.     Conjunctiva/sclera: Conjunctivae normal.     Pupils: Pupils are equal, round, and reactive to light.  Neck:     Thyroid: No thyromegaly.     Vascular: No carotid bruit or JVD.  Cardiovascular:     Rate and Rhythm: Normal rate and regular rhythm.     Heart sounds: Normal heart sounds. No murmur heard.    No gallop.  Pulmonary:     Effort: Pulmonary effort is normal.     Breath sounds: Normal breath sounds.  Abdominal:     General: Bowel sounds are normal.     Palpations: Abdomen is soft. There is no hepatomegaly or splenomegaly.  Musculoskeletal:     Cervical back: Normal range of motion and neck supple.     Right lower leg: No edema.     Left lower leg: No edema.  Lymphadenopathy:     Cervical: No cervical adenopathy.  Skin:    General: Skin is warm and dry.  Neurological:     Mental Status: She is alert and oriented to person, place, and time.  Psychiatric:        Attention and Perception: Attention normal.        Mood and Affect: Mood normal.        Behavior: Behavior normal. Behavior is cooperative.        Thought Content: Thought content normal.        Judgment: Judgment normal.     Results for orders placed or performed in visit on 05/02/22  TSH  Result Value Ref Range   TSH 0.437 (L) 0.450 - 4.500 uIU/mL  Prolactin  Result Value Ref Range   Prolactin 21.7 4.8 - 23.3 ng/mL  FSH  Result Value Ref Range   FSH 5.8 mIU/mL  LH  Result Value Ref Range   LH 11.8 mIU/mL      Assessment & Plan:   Problem List Items Addressed This Visit       Other   Chlamydia infection - Primary    Acute, in July 2023 -- presents for retest today -- urine obtained.  Treat if ongoing infection present.      Relevant Orders   GC/Chlamydia Probe Amp   WET PREP FOR TRICH, YEAST, CLUE   Low TSH level  Noted recent labs, recheck today  and determine next steps after return of labs.      Relevant Orders   T4, free   Thyroid peroxidase antibody   TSH   Vaginal discharge    Acute and ongoing.  Check wet prep today and treat as needed.  Discussed with patient.      Other Visit Diagnoses     Birth control counseling       Referral to GYN to discuss Nexplanon and IUD.   Relevant Orders   Ambulatory referral to Gynecology        Follow up plan: Return in about 7 months (around 01/20/2023) for Annual physical.

## 2022-06-06 NOTE — Assessment & Plan Note (Addendum)
Acute, in July 2023 -- presents for retest today -- urine obtained.  Treat if ongoing infection present.

## 2022-06-07 LAB — TSH: TSH: 0.697 u[IU]/mL (ref 0.450–4.500)

## 2022-06-07 LAB — T4, FREE: Free T4: 1.54 ng/dL (ref 0.93–1.60)

## 2022-06-07 LAB — THYROID PEROXIDASE ANTIBODY: Thyroperoxidase Ab SerPl-aCnc: 13 IU/mL (ref 0–26)

## 2022-06-07 NOTE — Progress Notes (Signed)
Contacted via MyChart   Good morning Brandi [name], your thyroid labs are normal this check.  Great news!!  Urine testing will take a couple days and I will let you know how it looks.  Let me know if you do not hear from GYN over next week and let me know you received message about the vaginal swab + antibiotic I sent in:) Keep being amazing!!  Thank you for allowing me to participate in your care.  I appreciate you. Kindest regards, Lydiana Milley

## 2022-06-11 LAB — GC/CHLAMYDIA PROBE AMP
Chlamydia trachomatis, NAA: NEGATIVE
Neisseria Gonorrhoeae by PCR: NEGATIVE

## 2022-06-11 NOTE — Progress Notes (Signed)
Contacted via MyChart   Good morning Brandi [name] all results are normal. Great news!!

## 2022-06-12 ENCOUNTER — Encounter (HOSPITAL_BASED_OUTPATIENT_CLINIC_OR_DEPARTMENT_OTHER): Payer: Medicaid Other | Admitting: Medical

## 2022-06-28 DIAGNOSIS — Z111 Encounter for screening for respiratory tuberculosis: Secondary | ICD-10-CM | POA: Diagnosis not present

## 2022-07-02 ENCOUNTER — Ambulatory Visit: Payer: Medicaid Other | Admitting: Nurse Practitioner

## 2022-07-03 ENCOUNTER — Encounter: Payer: Medicaid Other | Admitting: Certified Nurse Midwife

## 2022-07-21 DIAGNOSIS — U071 COVID-19: Secondary | ICD-10-CM | POA: Diagnosis not present

## 2022-07-21 DIAGNOSIS — R509 Fever, unspecified: Secondary | ICD-10-CM | POA: Diagnosis not present

## 2022-07-21 DIAGNOSIS — Z91014 Allergy to mammalian meats: Secondary | ICD-10-CM | POA: Diagnosis not present

## 2022-07-21 DIAGNOSIS — R059 Cough, unspecified: Secondary | ICD-10-CM | POA: Diagnosis not present

## 2022-07-21 DIAGNOSIS — Z91018 Allergy to other foods: Secondary | ICD-10-CM | POA: Diagnosis not present

## 2022-08-05 ENCOUNTER — Encounter: Payer: Self-pay | Admitting: Nurse Practitioner

## 2022-08-05 ENCOUNTER — Telehealth (INDEPENDENT_AMBULATORY_CARE_PROVIDER_SITE_OTHER): Payer: Medicaid Other | Admitting: Nurse Practitioner

## 2022-08-05 DIAGNOSIS — K529 Noninfective gastroenteritis and colitis, unspecified: Secondary | ICD-10-CM | POA: Diagnosis not present

## 2022-08-05 NOTE — Progress Notes (Signed)
There were no vitals taken for this visit.   Subjective:    Patient ID: Brandi [name], female    DOB: [birth year], 22 y.o.   MRN: [id number]  HPI: Brandi [name] is a 22 y.o. female  Chief Complaint  Patient presents with   Nausea    Diarrhea since over the weekend, and needs a Dr. Note to return to work. Nausea and diarrhea has resolved since yesterday but still having some stomach pain   This visit was completed via telephone due to the restrictions of the COVID-19 pandemic. All issues as above were discussed and addressed but no physical exam was performed. If it was felt that the patient should be evaluated in the office, they were directed there. The patient verbally consented to this visit. Patient was unable to complete an audio/visual visit due to Technical difficulties", "Lack of internet. Due to the catastrophic nature of the COVID-19 pandemic, this visit was done through audio contact only. Location of the patient: home Location of the provider: work Those involved with this call:  Provider: Marnee Guarneri, DNP CMA: Irena Reichmann, [place] Desk/Registration: [employer]  Time spent on call:  21 minutes on the phone discussing health concerns. 15 minutes total spent in review of patient's record and preparation of their chart.  I verified patient identity using two factors (patient name and date of birth). Patient consents verbally to being seen via telemedicine visit today.    DIARRHEA Started with symptoms middle of Friday last week and then as weekend went on this became worse.  Had abdominal pain and N&V.  Starting to feel better, has a 4/10 pain now and improving.   Status: better Treatments attempted:  Tylenol Fever: no Nausea: yes improved Vomiting: yes improved Weight loss: no Decreased appetite:  yes Diarrhea: yes improved Constipation: no Blood in stool: no Heartburn: no Jaundice: no Rash: no Dysuria/urinary frequency: no Hematuria:  no History of sexually transmitted disease: no Recurrent NSAID use: no   Relevant past medical, surgical, family and social history reviewed and updated as indicated. Interim medical history since our last visit reviewed. Allergies and medications reviewed and updated.  Review of Systems  Constitutional:  Positive for fatigue. Negative for activity change, appetite change and fever.  HENT: Negative.    Respiratory:  Negative for cough, chest tightness, shortness of breath and wheezing.   Cardiovascular:  Negative for chest pain, palpitations and leg swelling.  Gastrointestinal:  Positive for abdominal pain, diarrhea, nausea and vomiting. Negative for abdominal distention and constipation.  Endocrine: Negative.   Neurological:  Negative for dizziness, numbness and headaches.  Psychiatric/Behavioral: Negative.      Per HPI unless specifically indicated above     Objective:    There were no vitals taken for this visit.  Wt Readings from Last 3 Encounters:  06/06/22 136 lb 12.8 oz (62.1 kg) (67 %, Z= 0.45)*  05/02/22 133 lb 14.4 oz (60.7 kg) (64 %, Z= 0.35)*  04/14/22 134 lb 12.8 oz (61.1 kg) (65 %, Z= 0.39)*   * Growth percentiles are based on CDC (Girls, 2-20 Years) data.    Physical Exam  Unable to perform due to telephone visit only.  Results for orders placed or performed in visit on 06/06/22  GC/Chlamydia Probe Amp   Specimen: Urine   UR  Result Value Ref Range   Chlamydia trachomatis, NAA Negative Negative   Neisseria Gonorrhoeae by PCR Negative Negative  WET PREP FOR [place], YEAST, CLUE  Specimen: Sterile Swab   Sterile Swab  Result Value Ref Range   Trichomonas Exam Negative Negative   Yeast Exam Negative Negative   Clue Cell Exam Positive (A) Negative  T4, free  Result Value Ref Range   Free T4 1.54 0.93 - 1.60 ng/dL  Thyroid peroxidase antibody  Result Value Ref Range   Thyroperoxidase Ab SerPl-aCnc 13 0 - 26 IU/mL  TSH  Result Value Ref Range   TSH  0.697 0.450 - 4.500 uIU/mL      Assessment & Plan:   Problem List Items Addressed This Visit       Digestive   Gastroenteritis - Primary    Acute, improved at this time.  Recommend continue focus on BLAND diet until 100% improved.  Work note provided.       I discussed the assessment and treatment plan with the patient. The patient was provided an opportunity to ask questions and all were answered. The patient agreed with the plan and demonstrated an understanding of the instructions.   The patient was advised to call back or seek an in-person evaluation if the symptoms worsen or if the condition fails to improve as anticipated.   I provided 21+ minutes of time during this encounter.    Follow up plan: Return if symptoms worsen or fail to improve.

## 2022-08-05 NOTE — Patient Instructions (Signed)
Food Choices to Help Relieve Diarrhea, Adult Diarrhea can make you feel weak and cause you to become dehydrated. Dehydration is a condition in which there is not enough water or other fluids in the body. It is important to choose the right foods and drinks to: Relieve diarrhea. Replace lost fluids and nutrients. Prevent dehydration. What are tips for following this plan? Relieving diarrhea Avoid foods that make your diarrhea worse. These may include: Foods and drinks that are sweetened with high-fructose corn syrup, honey, or sweeteners such as xylitol, sorbitol, and mannitol. Check food labels for these ingredients. Fried, greasy, or spicy foods. Raw fruits and vegetables. Eat foods that are rich in probiotics. These include foods such as yogurt and fermented milk products. Probiotics can help increase healthy bacteria in your stomach and intestines (gastrointestinal or GI tract). This may help digestion and stop diarrhea. If you have lactose intolerance, avoid dairy products. These may make your diarrhea worse. Take medicine to help stop diarrhea only as told by your health care provider. Replacing nutrients  Eat bland, easy-to-digest foods in small amounts as you are able, until your diarrhea starts to get better. These foods include bananas, applesauce, rice, toast, and crackers. Over time, add nutrient-rich foods as your body tolerates them or as told by your health care provider. These include: Well-cooked protein foods, such as eggs, lean meats like fish or chicken without skin, and tofu. Peeled, seeded, and soft-cooked fruits and vegetables. Low-fat dairy products. Whole grains. Take vitamin and mineral supplements as told by your health care provider. Preventing dehydration  Start by sipping water or a solution to prevent dehydration (oral rehydration solution, or ORS). This is a drink that helps replace fluids and minerals your body has lost. You can buy an ORS at pharmacies and  retail stores. Try to drink at least 8-10 cups (2,000-2,500 mL) of fluid each day to help replace lost fluids. If your urine is pale yellow, you are getting enough fluids. You may drink other liquids in addition to water, such as fruit juice that you have added water to (diluted fruit juice) or low-calorie sports drinks, as tolerated or as told by your health care provider. Avoid drinks with caffeine, such as coffee, tea, or soft drinks. Avoid alcohol. This information is not intended to replace advice given to you by your health care provider. Make sure you discuss any questions you have with your health care provider. Document Revised: 12/03/2021 Document Reviewed: 12/03/2021 Elsevier Patient Education  [place].

## 2022-08-05 NOTE — Assessment & Plan Note (Signed)
Acute, improved at this time.  Recommend continue focus on BLAND diet until 100% improved.  Work note provided.

## 2022-09-30 ENCOUNTER — Ambulatory Visit: Payer: Medicaid Other | Admitting: Nurse Practitioner

## 2022-10-02 ENCOUNTER — Telehealth: Payer: Medicaid Other | Admitting: Urgent Care

## 2022-10-02 DIAGNOSIS — J301 Allergic rhinitis due to pollen: Secondary | ICD-10-CM

## 2022-10-02 DIAGNOSIS — J00 Acute nasopharyngitis [common cold]: Secondary | ICD-10-CM | POA: Diagnosis not present

## 2022-10-02 MED ORDER — LEVOCETIRIZINE DIHYDROCHLORIDE 5 MG PO TABS: 5.0000 mg | ORAL_TABLET | Freq: Every evening | ORAL | 0 refills | Status: DC

## 2022-10-02 MED ORDER — FLUTICASONE PROPIONATE 50 MCG/ACT NA SUSP: 2.0000 | Freq: Every day | NASAL | 0 refills | Status: DC

## 2022-10-02 NOTE — Progress Notes (Signed)
Virtual Visit Consent   Brandi [name], you are scheduled for a virtual visit with a [facility] provider today. Just as with appointments in the office, your consent must be obtained to participate. Your consent will be active for this visit and any virtual visit you may have with one of our providers in the next 365 days. If you have a MyChart account, a copy of this consent can be sent to you electronically.  As this is a virtual visit, video technology does not allow for your provider to perform a traditional examination. This may limit your provider's ability to fully assess your condition. If your provider identifies any concerns that need to be evaluated in person or the need to arrange testing (such as labs, EKG, etc.), we will make arrangements to do so. Although advances in technology are sophisticated, we cannot ensure that it will always work on either your end or our end. If the connection with a video visit is poor, the visit may have to be switched to a telephone visit. With either a video or telephone visit, we are not always able to ensure that we have a secure connection.  By engaging in this virtual visit, you consent to the provision of healthcare and authorize for your insurance to be billed (if applicable) for the services provided during this visit. Depending on your insurance coverage, you may receive a charge related to this service.  I need to obtain your verbal consent now. Are you willing to proceed with your visit today? Brandi [name] has provided verbal consent on 10/02/2022 for a virtual visit (video or telephone). [name], PA  Date: 10/02/2022 2:37 PM  Virtual Visit via Video Note   I, [place], connected with  Brandi [name]  ([id number], 26-Nov-2000) on 10/02/22 at  2:30 PM EDT by a video-enabled telemedicine application and verified that I am speaking with the correct person using two identifiers.  Location: Patient: Virtual  Visit Location Patient: Home Provider: Virtual Visit Location Provider: Home Office   I discussed the limitations of evaluation and management by telemedicine and the availability of in person appointments. The patient expressed understanding and agreed to proceed.    History of Present Illness: Brandi [name] is a 22 y.o. who identifies as a female who was assigned female at birth, and is being seen today for cold symptoms.  HPI: 22yo pt presents with concerns of a cold. Sunday night came down with a cold vs allergies. Was gone this weekend where there was lots of pollen. Has hx of pollen allergies. Started having a cough, congestion, stuffy nose and greenish/yellow mucous. Fever 101 resolved on Tuesday night. 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 Needs note to return. She took OTC cold and flu medications with complete resolution to her symptoms. She does report itchy eyes and nasal congestion when she goes outside in the pollen however.    Problems:  Patient Active Problem List   Diagnosis Date Noted   Gastroenteritis 08/05/2022   Low TSH level 06/06/2022   Chlamydia infection 01/11/2022   Vaginal discharge 01/09/2022   Neurogenic bowel 11/07/2017    Allergies:  Allergies  Allergen Reactions   Plum Pulp Hives    Oral reaction    Apple Juice     Patient states she is allergic to any apple products   Pork Allergy    Medications:  Current Outpatient Medications:    fluticasone (  FLONASE) 50 MCG/ACT nasal spray, Place 2 sprays into both nostrils daily., Disp: 16 g, Rfl: 0   levocetirizine (XYZAL) 5 MG tablet, Take 1 tablet (5 mg total) by mouth every evening., Disp: 30 tablet, Rfl: 0  Observations/Objective: Patient is well-developed, well-nourished in no acute distress.  Resting comfortably at home.  Head is normocephalic, atraumatic.  No labored breathing.  Speech is clear and coherent with logical content.  Patient is alert and oriented at  baseline.    Assessment and Plan: 1. Seasonal allergic rhinitis due to pollen  2. Acute nasopharyngitis (common cold)  Start xyzal and flonase for your allergy symptoms. Cold has resolved and thus does not require further intervention.  Follow Up Instructions: I discussed the assessment and treatment plan with the patient. The patient was provided an opportunity to ask questions and all were answered. The patient agreed with the plan and demonstrated an understanding of the instructions.  A copy of instructions were sent to the patient via MyChart unless otherwise noted below.    The patient was advised to call back or seek an in-person evaluation if the symptoms worsen or if the condition fails to improve as anticipated.  Time:  I spent 6 minutes with the patient via telehealth technology discussing the above problems/concerns.    [place], PA

## 2022-10-02 NOTE — Patient Instructions (Addendum)
  Brandi [name], thank you for joining [name], PA for today's virtual visit.  While this provider is not your primary care provider (PCP), if your PCP is located in our provider database this encounter information will be shared with them immediately following your visit.   [facility] account gives you access to today's visit and all your visits, tests, and labs performed at [facility] " click here if you don't have a [facility] account or go to [email or web address]  Consent: (Patient) Brandi [name] provided verbal consent for this virtual visit at the beginning of the encounter.  Current Medications:  Current Outpatient Medications:    fluticasone (FLONASE) 50 MCG/ACT nasal spray, Place 2 sprays into both nostrils daily., Disp: 16 g, Rfl: 0   levocetirizine (XYZAL) 5 MG tablet, Take 1 tablet (5 mg total) by mouth every evening., Disp: 30 tablet, Rfl: 0   Medications ordered in this encounter:  Meds ordered this encounter  Medications   levocetirizine (XYZAL) 5 MG tablet    Sig: Take 1 tablet (5 mg total) by mouth every evening.    Dispense:  30 tablet    Refill:  0    Order Specific Question:   Supervising Provider    Answer:   Chase Picket [id number]   fluticasone (FLONASE) 50 MCG/ACT nasal spray    Sig: Place 2 sprays into both nostrils daily.    Dispense:  16 g    Refill:  0    Order Specific Question:   Supervising Provider    Answer:   Chase Picket [id number]     *If you need refills on other medications prior to your next appointment, please contact your pharmacy*  Follow-Up: Call back or seek an in-person evaluation if the symptoms worsen or if the condition fails to improve as anticipated.  [facility] [phone]  Other Instructions Start taking xyzal one tablet every evening for allergy symptoms. Use one spray each nostril 1-2x/ day of flonase for nasal congestion. You are  cleared to return to work.   If you have been instructed to have an in-person evaluation today at a local Urgent Care facility, please use the link below. It will take you to a list of all of our available [facility], including address, phone number and hours of operation. Please do not delay care.  [facility]  If you or a family member do not have a primary care provider, use the link below to schedule a visit and establish care. When you choose a [facility] primary care physician or advanced practice provider, you gain a long-term partner in health. Find a Primary Care Provider  Learn more about [facility] in-office and virtual care options: [facility]

## 2022-11-13 ENCOUNTER — Ambulatory Visit: Payer: Medicaid Other | Admitting: Physician Assistant

## 2022-11-13 NOTE — Progress Notes (Deleted)
          Acute Office Visit   Patient: Brandi [name]   DOB: 04-22-2001   21 y.o. Female  MRN: [id number] Visit Date: 11/13/2022  Today's healthcare provider: Oswaldo Conroy Naydeen Speirs, PA-C  Introduced myself to the patient as a Secondary school teacher and provided education on APPs in clinical practice.    No chief complaint on file.  Subjective    HPI    Medications: Outpatient Medications Prior to Visit  Medication Sig   fluticasone (FLONASE) 50 MCG/ACT nasal spray Place 2 sprays into both nostrils daily.   levocetirizine (XYZAL) 5 MG tablet Take 1 tablet (5 mg total) by mouth every evening.   No facility-administered medications prior to visit.    Review of Systems  {Labs  Heme  Chem  Endocrine  Serology  Results Review (optional):23779}   Objective    There were no vitals taken for this visit. {Show previous vital signs (optional):23777}  Physical Exam    No results found for any visits on 11/13/22.  Assessment & Plan      No follow-ups on file.

## 2022-11-14 ENCOUNTER — Telehealth: Payer: Medicaid Other | Admitting: Family Medicine

## 2022-11-14 DIAGNOSIS — B9689 Other specified bacterial agents as the cause of diseases classified elsewhere: Secondary | ICD-10-CM

## 2022-11-14 DIAGNOSIS — N760 Acute vaginitis: Secondary | ICD-10-CM | POA: Diagnosis not present

## 2022-11-14 MED ORDER — METRONIDAZOLE 500 MG PO TABS: 500.0000 mg | ORAL_TABLET | Freq: Three times a day (TID) | ORAL | 0 refills | Status: AC

## 2022-11-14 NOTE — Progress Notes (Signed)
E-Visit for Vaginal Symptoms  We are sorry that you are not feeling well. Here is how we plan to help! Based on what you shared with me it looks like you: May have a vaginosis due to bacteria  Vaginosis is an inflammation of the vagina that can result in discharge, itching and pain. The cause is usually a change in the normal balance of vaginal bacteria or an infection. Vaginosis can also result from reduced estrogen levels after menopause.  The most common causes of vaginosis are:   Bacterial vaginosis which results from an overgrowth of one on several organisms that are normally present in your vagina.   Yeast infections which are caused by a naturally occurring fungus called candida.   Vaginal atrophy (atrophic vaginosis) which results from the thinning of the vagina from reduced estrogen levels after menopause.   Trichomoniasis which is caused by a parasite and is commonly transmitted by sexual intercourse.  Factors that increase your risk of developing vaginosis include: Medications, such as antibiotics and steroids Uncontrolled diabetes Use of hygiene products such as bubble bath, vaginal spray or vaginal deodorant Douching Wearing damp or tight-fitting clothing Using an intrauterine device (IUD) for birth control Hormonal changes, such as those associated with pregnancy, birth control pills or menopause Sexual activity Having a sexually transmitted infection  Your treatment plan is Metronidazole or Flagyl 500mg twice a day for 7 days.  I have electronically sent this prescription into the pharmacy that you have chosen.  Be sure to take all of the medication as directed. Stop taking any medication if you develop a rash, tongue swelling or shortness of breath. Mothers who are breast feeding should consider pumping and discarding their breast milk while on these antibiotics. However, there is no consensus that infant exposure at these doses would be harmful.  Remember that  medication creams can weaken latex condoms. .   HOME CARE:  Good hygiene may prevent some types of vaginosis from recurring and may relieve some symptoms:  Avoid baths, hot tubs and whirlpool spas. Rinse soap from your outer genital area after a shower, and dry the area well to prevent irritation. Don't use scented or harsh soaps, such as those with deodorant or antibacterial action. Avoid irritants. These include scented tampons and pads. Wipe from front to back after using the toilet. Doing so avoids spreading fecal bacteria to your vagina.  Other things that may help prevent vaginosis include:  Don't douche. Your vagina doesn't require cleansing other than normal bathing. Repetitive douching disrupts the normal organisms that reside in the vagina and can actually increase your risk of vaginal infection. Douching won't clear up a vaginal infection. Use a latex condom. Both female and female latex condoms may help you avoid infections spread by sexual contact. Wear cotton underwear. Also wear pantyhose with a cotton crotch. If you feel comfortable without it, skip wearing underwear to bed. Yeast thrives in moist environments Your symptoms should improve in the next day or two.  GET HELP RIGHT AWAY IF:  You have pain in your lower abdomen ( pelvic area or over your ovaries) You develop nausea or vomiting You develop a fever Your discharge changes or worsens You have persistent pain with intercourse You develop shortness of breath, a rapid pulse, or you faint.  These symptoms could be signs of problems or infections that need to be evaluated by a medical provider now.  MAKE SURE YOU   Understand these instructions. Will watch your condition. Will get help right   away if you are not doing well or get worse.  Thank you for choosing an e-visit.  Your e-visit answers were reviewed by a board certified advanced clinical practitioner to complete your personal care plan. Depending upon the  condition, your plan could have included both over the counter or prescription medications.  Please review your pharmacy choice. Make sure the pharmacy is open so you can pick up prescription now. If there is a problem, you may contact your provider through MyChart messaging and have the prescription routed to another pharmacy.  Your safety is important to us. If you have drug allergies check your prescription carefully.   For the next 24 hours you can use MyChart to ask questions about today's visit, request a non-urgent call back, or ask for a work or school excuse. You will get an email in the next two days asking about your experience. I hope that your e-visit has been valuable and will speed your recovery.    have provided 5 minutes of non face to face time during this encounter for chart review and documentation.   

## 2023-01-12 ENCOUNTER — Encounter: Payer: Medicaid Other | Admitting: Nurse Practitioner

## 2023-01-18 NOTE — Patient Instructions (Signed)

## 2023-01-21 ENCOUNTER — Ambulatory Visit (INDEPENDENT_AMBULATORY_CARE_PROVIDER_SITE_OTHER): Payer: MEDICAID | Admitting: Nurse Practitioner

## 2023-01-21 ENCOUNTER — Encounter: Payer: Self-pay | Admitting: Nurse Practitioner

## 2023-01-21 VITALS — BP 107/73 | HR 80 | Temp 98.2°F | Ht 69.9 in | Wt 147.4 lb

## 2023-01-21 DIAGNOSIS — R7989 Other specified abnormal findings of blood chemistry: Secondary | ICD-10-CM

## 2023-01-21 DIAGNOSIS — Z13220 Encounter for screening for lipoid disorders: Secondary | ICD-10-CM

## 2023-01-21 DIAGNOSIS — F418 Other specified anxiety disorders: Secondary | ICD-10-CM

## 2023-01-21 DIAGNOSIS — E559 Vitamin D deficiency, unspecified: Secondary | ICD-10-CM | POA: Diagnosis not present

## 2023-01-21 DIAGNOSIS — Z136 Encounter for screening for cardiovascular disorders: Secondary | ICD-10-CM

## 2023-01-21 DIAGNOSIS — K592 Neurogenic bowel, not elsewhere classified: Secondary | ICD-10-CM

## 2023-01-21 DIAGNOSIS — Z0000 Encounter for general adult medical examination without abnormal findings: Secondary | ICD-10-CM

## 2023-01-21 NOTE — Assessment & Plan Note (Signed)
History of, overall stable at this time.  Will check labs today.

## 2023-01-21 NOTE — Assessment & Plan Note (Signed)
Noted recent labs, recheck today and determine next steps after return of labs.

## 2023-01-21 NOTE — Assessment & Plan Note (Signed)
History of low levels reported, check today and start supplement as needed.

## 2023-01-21 NOTE — Assessment & Plan Note (Signed)
New onset due to moving in with her mother. We discussed all treatment options, including no treatment, therapy, supplements, and medications.  She would like referral to therapy, which was placed today.  Denies SI/HI.  Return if worsening or ongoing anxiety.

## 2023-01-21 NOTE — Progress Notes (Signed)
BP 107/73   Pulse 80   Temp 98.2 F (36.8 C) (Oral)   Ht 5' 9.9" (1.775 m)   Wt 147 lb 6.4 oz (66.9 kg)   LMP 12/30/2022 (Exact Date)   SpO2 99%   BMI 21.21 kg/m    Subjective:    Patient ID: Brandi [name], female    DOB: 10-Jan-2001, 22 y.o.   MRN: [id number]  HPI: Brandi [name] is a 22 y.o. female presenting on 01/21/2023 for comprehensive medical examination. Current medical complaints include:none  She currently lives with: mom Menopausal Symptoms: no  ANXIETY More stress with moving back home with her mother.  Took Vitamin D in past to help mood, has history of low.  In past has history of low TSH levels, recent has been stable.  Continues to have occasional issues with passing bowels. Mood status: stable Psychotherapy/counseling: none Previous psychiatric medications: none Depressed mood: no Anxious mood: yes Anhedonia: yes Significant weight loss or gain: no Insomnia: yes hard to fall asleep Fatigue: yes Feelings of worthlessness or guilt: no Impaired concentration/indecisiveness: no Suicidal ideations: no Hopelessness: no Crying spells: yes    01/21/2023    2:50 PM 06/06/2022    3:10 PM 04/14/2022   11:03 AM 01/09/2022    9:10 AM  Depression screen PHQ 2/9  Decreased Interest 2 1 0 1  Down, Depressed, Hopeless 2 1 0 0  PHQ - 2 Score 4 2 0 1  Altered sleeping 3 0 3 3  Tired, decreased energy 2 1 1 1   Change in appetite 0 0 0 0  Feeling bad or failure about yourself  2 0 0 0  Trouble concentrating 0 0 0 0  Moving slowly or fidgety/restless 0 0 0 0  Suicidal thoughts 0 0 0 0  PHQ-9 Score 11 3 4 5   Difficult doing work/chores Somewhat difficult Not difficult at all Not difficult at all Not difficult at all      01/21/2023    2:50 PM 06/06/2022    3:10 PM 04/14/2022   11:03 AM 01/09/2022    9:10 AM  GAD 7 : Generalized Anxiety Score  Nervous, Anxious, on Edge 2 2 0 2  Control/stop worrying 1 0 1 1  Worry too much - different things 3  1 2 1   Trouble relaxing 2 0 0 1  Restless 1 0 0 0  Easily annoyed or irritable 3 1 2 2   Afraid - awful might happen 3 0 0 0  Total GAD 7 Score 15 4 5 7   Anxiety Difficulty Somewhat difficult Somewhat difficult Not difficult at all Not difficult at all      11/19/2021    6:48 PM 01/09/2022    9:10 AM 04/14/2022   11:03 AM 01/21/2023    2:49 PM  Fall Risk  Falls in the past year?  0 0 0  Was there an injury with Fall?  0 0 0  Fall Risk Category Calculator  0 0 0  Fall Risk Category (Retired)  Low Low   (RETIRED) Patient Fall Risk Level Low fall risk Low fall risk    Patient at Risk for Falls Due to  No Fall Risks No Fall Risks No Fall Risks  Fall risk Follow up  Falls evaluation completed Falls evaluation completed Falls evaluation completed    Past Medical History:  Past Medical History:  Diagnosis Date   Transverse myelitis (HCC)     Surgical History:  Past Surgical History:  Procedure  Laterality Date   HERNIA REPAIR      Medications:  No current outpatient medications on file prior to visit.   No current facility-administered medications on file prior to visit.    Allergies:  Allergies  Allergen Reactions   Plum Pulp Hives    Oral reaction    Apple Juice     Patient states she is allergic to any apple products   Pork Allergy     Social History:  Social History   Socioeconomic History   Marital status: Significant Other    Spouse name: Not on file   Number of children: Not on file   Years of education: Not on file   Highest education level: Not on file  Occupational History   Not on file  Tobacco Use   Smoking status: Never   Smokeless tobacco: Never  Vaping Use   Vaping status: Some Days  Substance and Sexual Activity   Alcohol use: Yes    Comment: special ocassions   Drug use: Not Currently    Types: Marijuana   Sexual activity: Yes  Other Topics Concern   Not on file  Social History Narrative   Not on file   Social Determinants of Health    Financial Resource Strain: Low Risk  (01/21/2023)   Overall Financial Resource Strain (CARDIA)    Difficulty of Paying Living Expenses: Not hard at all  Food Insecurity: No Food Insecurity (01/21/2023)   Hunger Vital Sign    Worried About Running Out of Food in the Last Year: Never true    Ran Out of Food in the Last Year: Never true  Transportation Needs: No Transportation Needs (01/21/2023)   PRAPARE - Administrator, Civil Service (Medical): No    Lack of Transportation (Non-Medical): No  Physical Activity: Inactive (01/21/2023)   Exercise Vital Sign    Days of Exercise per Week: 0 days    Minutes of Exercise per Session: 0 min  Stress: No Stress Concern Present (01/21/2023)   [employer] of Occupational Health - Occupational Stress Questionnaire    Feeling of Stress : Only a little  Social Connections: Socially Isolated (01/21/2023)   Social Connection and Isolation Panel [NHANES]    Frequency of Communication with Friends and Family: Three times a week    Frequency of Social Gatherings with Friends and Family: Three times a week    Attends Religious Services: Never    Active Member of Clubs or Organizations: No    Attends Banker Meetings: Never    Marital Status: Never married  Intimate Partner Violence: Not At Risk (01/21/2023)   Humiliation, Afraid, Rape, and Kick questionnaire    Fear of Current or Ex-Partner: No    Emotionally Abused: No    Physically Abused: No    Sexually Abused: No   Social History   Tobacco Use  Smoking Status Never  Smokeless Tobacco Never   Social History   Substance and Sexual Activity  Alcohol Use Yes   Comment: special ocassions    Family History:  History reviewed. No pertinent family history.  Past medical history, surgical history, medications, allergies, family history and social history reviewed with patient today and changes made to appropriate areas of the chart.   ROS All other ROS negative  except what is listed above and in the HPI.      Objective:    BP 107/73   Pulse 80   Temp 98.2 F (36.8 C) (Oral)  Ht 5' 9.9" (1.775 m)   Wt 147 lb 6.4 oz (66.9 kg)   LMP 12/30/2022 (Exact Date)   SpO2 99%   BMI 21.21 kg/m   Wt Readings from Last 3 Encounters:  01/21/23 147 lb 6.4 oz (66.9 kg) (78%, Z= 0.77)*  06/06/22 136 lb 12.8 oz (62.1 kg) (67%, Z= 0.45)*  05/02/22 133 lb 14.4 oz (60.7 kg) (64%, Z= 0.35)*   * Growth percentiles are based on CDC (Girls, 2-20 Years) data.    Physical Exam Vitals and nursing note reviewed. Exam conducted with a chaperone present.  Constitutional:      General: She is awake. She is not in acute distress.    Appearance: She is well-developed and well-groomed. She is not ill-appearing or toxic-appearing.  HENT:     Head: Normocephalic and atraumatic.     Right Ear: Hearing, tympanic membrane, ear canal and external ear normal. No drainage.     Left Ear: Hearing, tympanic membrane, ear canal and external ear normal. No drainage.     Nose: Nose normal.     Right Sinus: No maxillary sinus tenderness or frontal sinus tenderness.     Left Sinus: No maxillary sinus tenderness or frontal sinus tenderness.     Mouth/Throat:     Mouth: Mucous membranes are moist.     Pharynx: Oropharynx is clear. Uvula midline. No pharyngeal swelling, oropharyngeal exudate or posterior oropharyngeal erythema.  Eyes:     General: Lids are normal.        Right eye: No discharge.        Left eye: No discharge.     Extraocular Movements: Extraocular movements intact.     Conjunctiva/sclera: Conjunctivae normal.     Pupils: Pupils are equal, round, and reactive to light.     Visual Fields: Right eye visual fields normal and left eye visual fields normal.  Neck:     Thyroid: No thyromegaly.     Vascular: No carotid bruit.     Trachea: Trachea normal.  Cardiovascular:     Rate and Rhythm: Normal rate and regular rhythm.     Heart sounds: Normal heart sounds. No  murmur heard.    No gallop.  Pulmonary:     Effort: Pulmonary effort is normal. No accessory muscle usage or respiratory distress.     Breath sounds: Normal breath sounds.  Abdominal:     General: Bowel sounds are normal.     Palpations: Abdomen is soft. There is no hepatomegaly or splenomegaly.     Tenderness: There is no abdominal tenderness.  Musculoskeletal:        General: Normal range of motion.     Cervical back: Normal range of motion and neck supple.     Right lower leg: No edema.     Left lower leg: No edema.  Lymphadenopathy:     Head:     Right side of head: No submental, submandibular, tonsillar, preauricular or posterior auricular adenopathy.     Left side of head: No submental, submandibular, tonsillar, preauricular or posterior auricular adenopathy.     Cervical: No cervical adenopathy.  Skin:    General: Skin is warm and dry.     Capillary Refill: Capillary refill takes less than 2 seconds.     Findings: No rash.  Neurological:     Mental Status: She is alert and oriented to person, place, and time.     Gait: Gait is intact.     Deep Tendon Reflexes: Reflexes are normal  and symmetric.     Reflex Scores:      Brachioradialis reflexes are 2+ on the right side and 2+ on the left side.      Patellar reflexes are 2+ on the right side and 2+ on the left side. Psychiatric:        Attention and Perception: Attention normal.        Mood and Affect: Mood normal.        Speech: Speech normal.        Behavior: Behavior normal. Behavior is cooperative.        Thought Content: Thought content normal.        Judgment: Judgment normal.     Results for orders placed or performed in visit on 06/06/22  GC/Chlamydia Probe Amp   Specimen: Urine   UR  Result Value Ref Range   Chlamydia trachomatis, NAA Negative Negative   Neisseria Gonorrhoeae by PCR Negative Negative  WET PREP FOR TRICH, YEAST, CLUE   Specimen: Sterile Swab   Sterile Swab  Result Value Ref Range    Trichomonas Exam Negative Negative   Yeast Exam Negative Negative   Clue Cell Exam Positive (A) Negative  T4, free  Result Value Ref Range   Free T4 1.54 0.93 - 1.60 ng/dL  Thyroid peroxidase antibody  Result Value Ref Range   Thyroperoxidase Ab SerPl-aCnc 13 0 - 26 IU/mL  TSH  Result Value Ref Range   TSH 0.697 0.450 - 4.500 uIU/mL      Assessment & Plan:   Problem List Items Addressed This Visit       Digestive   Neurogenic bowel    History of, overall stable at this time.  Will check labs today.      Relevant Orders   CBC with Differential/Platelet   Comprehensive metabolic panel   TSH     Other   Low TSH level - Primary    Noted recent labs, recheck today and determine next steps after return of labs.      Relevant Orders   TSH   T4, free   Thyroid peroxidase antibody   Situational anxiety    New onset due to moving in with her mother. We discussed all treatment options, including no treatment, therapy, supplements, and medications.  She would like referral to therapy, which was placed today.  Denies SI/HI.  Return if worsening or ongoing anxiety.      Relevant Orders   Ambulatory referral to Psychology   Vitamin D deficiency    History of low levels reported, check today and start supplement as needed.      Relevant Orders   VITAMIN D 25 Hydroxy (Vit-D Deficiency, Fractures)   Other Visit Diagnoses     Encounter for lipid screening for cardiovascular disease       Lipid panel on labs today.   Relevant Orders   Lipid Panel w/o Chol/HDL Ratio   Encounter for annual physical exam       Annual physical today with labs and health maintenance reviewed, discussed with patient.        Follow up plan: Return in about 1 year (around 01/21/2024) for Annual Exam.   LABORATORY TESTING:  - Pap smear: not applicable  IMMUNIZATIONS:   - Tdap: Tetanus vaccination status reviewed: last tetanus booster within 10 years. - Influenza: Up to date - Pneumovax: Not  applicable - Prevnar: Not applicable - COVID: Up to date - HPV: Up to date - Shingrix vaccine: Not applicable  SCREENING: -Mammogram: Not applicable  - Colonoscopy: Not applicable  - Bone Density: Not applicable  -Hearing Test: Not applicable  -Spirometry: Not applicable   PATIENT COUNSELING:   Advised to take 1 mg of folate supplement per day if capable of pregnancy.   Sexuality: Discussed sexually transmitted diseases, partner selection, use of condoms, avoidance of unintended pregnancy  and contraceptive alternatives.   Advised to avoid cigarette smoking.  I discussed with the patient that most people either abstain from alcohol or drink within safe limits (<=14/week and <=4 drinks/occasion for males, <=7/weeks and <= 3 drinks/occasion for females) and that the risk for alcohol disorders and other health effects rises proportionally with the number of drinks per week and how often a drinker exceeds daily limits.  Discussed cessation/primary prevention of drug use and availability of treatment for abuse.   Diet: Encouraged to adjust caloric intake to maintain  or achieve ideal body weight, to reduce intake of dietary saturated fat and total fat, to limit sodium intake by avoiding high sodium foods and not adding table salt, and to maintain adequate dietary potassium and calcium preferably from fresh fruits, vegetables, and low-fat dairy products.    Stressed the importance of regular exercise  Injury prevention: Discussed safety belts, safety helmets, smoke detector, smoking near bedding or upholstery.   Dental health: Discussed importance of regular tooth brushing, flossing, and dental visits.    NEXT PREVENTATIVE PHYSICAL DUE IN 1 YEAR. Return in about 1 year (around 01/21/2024) for Annual Exam.

## 2023-01-22 ENCOUNTER — Other Ambulatory Visit: Payer: Self-pay | Admitting: Nurse Practitioner

## 2023-01-22 LAB — COMPREHENSIVE METABOLIC PANEL
ALT: 12 IU/L (ref 0–32)
AST: 17 IU/L (ref 0–40)
Albumin: 4.6 g/dL (ref 4.0–5.0)
Alkaline Phosphatase: 64 IU/L (ref 42–106)
BUN: 9 mg/dL (ref 6–20)
Bilirubin Total: 1.4 mg/dL — ABNORMAL HIGH (ref 0.0–1.2)
CO2: 20 mmol/L (ref 20–29)
Calcium: 9.5 mg/dL (ref 8.7–10.2)
Chloride: 103 mmol/L (ref 96–106)
Creatinine, Ser: 0.73 mg/dL (ref 0.57–1.00)
Globulin, Total: 2.3 g/dL (ref 1.5–4.5)
Glucose: 88 mg/dL (ref 70–99)
Potassium: 4.1 mmol/L (ref 3.5–5.2)
Sodium: 138 mmol/L (ref 134–144)
Total Protein: 6.9 g/dL (ref 6.0–8.5)
eGFR: 121 mL/min/{1.73_m2} (ref >59)

## 2023-01-22 LAB — CBC WITH DIFFERENTIAL/PLATELET
Basophils Absolute: 0 10*3/uL (ref 0.0–0.2)
Basos: 1 %
EOS (ABSOLUTE): 0.1 10*3/uL (ref 0.0–0.4)
Eos: 3 %
Hematocrit: 39.3 % (ref 34.0–46.6)
Hemoglobin: 13 g/dL (ref 11.1–15.9)
Immature Grans (Abs): 0 10*3/uL (ref 0.0–0.1)
Immature Granulocytes: 0 %
Lymphocytes Absolute: 1.6 10*3/uL (ref 0.7–3.1)
Lymphs: 38 %
MCH: 26.7 pg (ref 26.6–33.0)
MCHC: 33.1 g/dL (ref 31.5–35.7)
MCV: 81 fL (ref 79–97)
Monocytes Absolute: 0.3 10*3/uL (ref 0.1–0.9)
Monocytes: 6 %
Neutrophils: 52 %
Platelets: 216 10*3/uL (ref 150–450)
RBC: 4.87 x10E6/uL (ref 3.77–5.28)
RDW: 13.3 % (ref 11.7–15.4)
WBC: 4.1 10*3/uL (ref 3.4–10.8)

## 2023-01-22 LAB — LIPID PANEL W/O CHOL/HDL RATIO
Cholesterol, Total: 145 mg/dL (ref 100–169)
HDL: 84 mg/dL (ref >39)
Triglycerides: 45 mg/dL (ref 0–89)
VLDL Cholesterol Cal: 10 mg/dL (ref 5–40)

## 2023-01-22 LAB — THYROID PEROXIDASE ANTIBODY: Thyroperoxidase Ab SerPl-aCnc: 11 IU/mL (ref 0–26)

## 2023-01-22 LAB — VITAMIN D 25 HYDROXY (VIT D DEFICIENCY, FRACTURES): Vit D, 25-Hydroxy: 9.4 ng/mL — ABNORMAL LOW (ref 30.0–100.0)

## 2023-01-22 LAB — TSH: TSH: 0.837 u[IU]/mL (ref 0.450–4.500)

## 2023-01-22 MED ORDER — CHOLECALCIFEROL 1.25 MG (50000 UT) PO TABS: 1.0000 | ORAL_TABLET | ORAL | 4 refills | Status: DC

## 2023-01-22 NOTE — Progress Notes (Signed)
Contacted via MyChart   Good morning Brandi [name], your labs have returned and overall they look fantastic with exception of Vitamin D which is very low.  I would like to start a weekly higher dose supplement for this to help increase level and help your overall bone health. Any questions about this?  I sent supplement in.  Keep being amazing!!  Thank you for allowing me to participate in your care.  I appreciate you. Kindest regards, Harvin Konicek

## 2023-04-08 ENCOUNTER — Encounter: Payer: Self-pay | Admitting: Nurse Practitioner

## 2023-04-22 ENCOUNTER — Ambulatory Visit: Payer: Medicaid Other

## 2023-07-01 NOTE — L&D Delivery Note (Signed)
 Delivery Note At 5:21 AM a viable female was delivered via  (Presentation: OA).  APGAR: 8, 9; weight  pending.   Placenta status: Spontaneous, Intact.  Cord: 3 vessels with the following complications: None.   OB: Harland Birkenhead, MD Anesthesia: Epidural and local Episiotomy:  none Lacerations: 1st degree;Perineal Suture Repair: 3.0 vicryl rapide Est. Blood Loss (mL): 300  She had an uncomplicated NSVD and repair of 2 first degree tears ( one supra clitoral and on of left sulcus Sponge and needle counts were correct. Mom to postpartum.  Baby to Couplet care / Skin to Skin.  Harland JAYSON Birkenhead 03/22/2024, 5:49 AM

## 2023-08-02 NOTE — Patient Instructions (Incomplete)

## 2023-08-03 ENCOUNTER — Ambulatory Visit (INDEPENDENT_AMBULATORY_CARE_PROVIDER_SITE_OTHER): Payer: Medicaid Other | Admitting: Nurse Practitioner

## 2023-08-03 ENCOUNTER — Encounter: Payer: Medicaid Other | Admitting: Nurse Practitioner

## 2023-08-03 ENCOUNTER — Encounter: Payer: Self-pay | Admitting: Nurse Practitioner

## 2023-08-03 VITALS — BP 109/68 | HR 84 | Temp 98.7°F | Ht 70.0 in | Wt 152.4 lb

## 2023-08-03 DIAGNOSIS — Z3200 Encounter for pregnancy test, result unknown: Secondary | ICD-10-CM

## 2023-08-03 NOTE — Patient Instructions (Addendum)
CONGRATULATIONS!!!!!  Common Medications Safe in Pregnancy  Acne:      Constipation:  Benzoyl Peroxide     Colace  Clindamycin      Dulcolax Suppository  Topica Erythromycin     Fibercon  Salicylic Acid      Metamucil         Miralax AVOID:        Senakot   Accutane    Cough:  Retin-A       Cough Drops  Tetracycline      Phenergan w/ Codeine if Rx  Minocycline      Robitussin (Plain & DM)  Antibiotics:     Crabs/Lice:  Ceclor       RID  Cephalosporins    AVOID:  E-Mycins      Kwell  Keflex  Macrobid/Macrodantin   Diarrhea:  Penicillin      Kao-Pectate  Zithromax      Imodium AD         PUSH FLUIDS AVOID:       Cipro     Fever:  Tetracycline      Tylenol (Regular or Extra  Minocycline       Strength)  Levaquin      Extra Strength-Do not          Exceed 8 tabs/24 hrs Caffeine:        200mg /day (equiv. To 1 cup of coffee or  approx. 3 12 oz sodas)         Gas: Cold/Hayfever:       Gas-X  Benadryl      Mylicon  Claritin       Phazyme  **Claritin-D        Chlor-Trimeton    Headaches:  Dimetapp      ASA-Free Excedrin  Drixoral-Non-Drowsy     Cold Compress  Mucinex (Guaifenasin)     Tylenol (Regular or Extra  Sudafed/Sudafed-12 Hour     Strength)  **Sudafed PE Pseudoephedrine   Tylenol Cold & Sinus     Vicks Vapor Rub  Zyrtec  **AVOID if Problems With Blood Pressure         Heartburn: Avoid lying down for at least 1 hour after meals  Aciphex      Maalox     Rash:  Milk of Magnesia     Benadryl    Mylanta       1% Hydrocortisone Cream  Pepcid  Pepcid Complete   Sleep Aids:  Prevacid      Ambien   Prilosec       Benadryl  Rolaids       Chamomile Tea  Tums (Limit 4/day)     Unisom         Tylenol PM         Warm milk-add vanilla or  Hemorrhoids:       Sugar for taste  Anusol/Anusol H.C.  (RX: Analapram 2.5%)  Sugar Substitutes:  Hydrocortisone OTC     Ok in moderation  Preparation H      Tucks        Vaseline lotion applied to tissue with  wiping    Herpes:     Throat:  Acyclovir      Oragel  Famvir  Valtrex     Vaccines:         Flu Shot Leg Cramps:       *Gardasil  Benadryl      Hepatitis A         Hepatitis B  Nasal Spray:       Pneumovax  Saline Nasal Spray     Polio Booster         Tetanus Nausea:       Tuberculosis test or PPD  Vitamin B6 25 mg TID   AVOID:    Dramamine      *Gardasil  Emetrol       Live Poliovirus  Ginger Root 250 mg QID    MMR (measles, mumps &  High Complex Carbs @ Bedtime    rebella)  Sea Bands-Accupressure    Varicella (Chickenpox)  Unisom 1/2 tab TID     *No known complications           If received before Pain:         Known pregnancy;   Darvocet       Resume series after  Lortab        Delivery  Percocet    Yeast:   Tramadol      Femstat  Tylenol 3      Gyne-lotrimin  Ultram       Monistat  Vicodin           MISC:         All Sunscreens           Hair Coloring/highlights          Insect Repellant's          (Including DEET)         Mystic Tans

## 2023-08-03 NOTE — Assessment & Plan Note (Signed)
Three positive urine tests at home.  G1P0 with LMP 06/26/23, placing her EDC around 04/01/24.  Will obtain blood work to further determine dates.  Recommend she start prenatal vitamin with folic acid and DHA.  Discussed that she can use Benadryl for sleep and TUMS for belching or heart burn.  We discussed that cramping can be normal, but if severe cramping or heavy bleeding to notify provider.

## 2023-08-03 NOTE — Progress Notes (Signed)
BP 109/68   Pulse 84   Temp 98.7 F (37.1 C) (Oral)   Ht 5\' 10"  (1.778 m)   Wt 152 lb 6.4 oz (69.1 kg)   LMP 06/26/2023 (Exact Date)   SpO2 98%   BMI 21.87 kg/m    Subjective:    Patient ID: Brandi [name], female    DOB: Jul 21, 2000, 22 y.o.   MRN: [id number]  HPI: Brandi [name] is a 23 y.o. female  Chief Complaint  Patient presents with   Pregnancy    Patient states she has had 3 positive home pregnancy tests. Requesting to have HCG drawn to see how far along she is. LMP was 06/26/23   PREGNANCY DIAGNOSIS Presents for pregnancy visit.  LMP 06/26/23. Father is involved in pregnancy. Gravida/Para: 1/0 Home pregnancy test: positive x2" "x3 Nausea:  only in middle of the night Vomiting: no Breast tenderness: yes Abdominal pain:  occasional cramping Vaginal bleeding: no Pregnancy or labor complications: no Fetal abnormalities: no Contraception:  none  Relevant past medical, surgical, family and social history reviewed and updated as indicated. Interim medical history since our last visit reviewed. Allergies and medications reviewed and updated.  Review of Systems  Constitutional:  Positive for fatigue. Negative for activity change, appetite change, diaphoresis and fever.  Respiratory:  Negative for cough, chest tightness and shortness of breath.   Cardiovascular:  Negative for chest pain, palpitations and leg swelling.  Gastrointestinal:  Positive for abdominal pain (occasional cramping) and nausea (occasional). Negative for abdominal distention and vomiting.  Neurological: Negative.   Psychiatric/Behavioral: Negative.     Per HPI unless specifically indicated above     Objective:    BP 109/68   Pulse 84   Temp 98.7 F (37.1 C) (Oral)   Ht 5\' 10"  (1.778 m)   Wt 152 lb 6.4 oz (69.1 kg)   LMP 06/26/2023 (Exact Date)   SpO2 98%   BMI 21.87 kg/m   Wt Readings from Last 3 Encounters:  08/03/23 152 lb 6.4 oz (69.1 kg)  01/21/23 147 lb 6.4 oz  (66.9 kg) (78%, Z= 0.77)*  06/06/22 136 lb 12.8 oz (62.1 kg) (67%, Z= 0.45)*   * Growth percentiles are based on CDC (Girls, 2-20 Years) data.    Physical Exam Vitals and nursing note reviewed.  Constitutional:      General: She is awake. She is not in acute distress.    Appearance: She is well-developed, well-groomed and underweight. She is not ill-appearing or toxic-appearing.  HENT:     Head: Normocephalic.     Right Ear: Hearing normal.     Left Ear: Hearing normal.     Nose: Nose normal.     Mouth/Throat:     Mouth: Mucous membranes are moist.  Eyes:     General: Lids are normal.        Right eye: No discharge.        Left eye: No discharge.     Conjunctiva/sclera: Conjunctivae normal.     Pupils: Pupils are equal, round, and reactive to light.  Neck:     Thyroid: No thyromegaly.     Vascular: No carotid bruit or JVD.  Cardiovascular:     Rate and Rhythm: Normal rate and regular rhythm.     Heart sounds: Normal heart sounds. No murmur heard.    No gallop.  Pulmonary:     Effort: Pulmonary effort is normal.     Breath sounds: Normal breath sounds.  Abdominal:  General: Bowel sounds are normal.     Palpations: Abdomen is soft. There is no hepatomegaly or splenomegaly.  Musculoskeletal:     Cervical back: Normal range of motion and neck supple.     Right lower leg: No edema.     Left lower leg: No edema.  Lymphadenopathy:     Cervical: No cervical adenopathy.  Skin:    General: Skin is warm and dry.  Neurological:     Mental Status: She is alert and oriented to person, place, and time.  Psychiatric:        Attention and Perception: Attention normal.        Mood and Affect: Mood normal.        Behavior: Behavior normal. Behavior is cooperative.        Thought Content: Thought content normal.        Judgment: Judgment normal.    Results for orders placed or performed in visit on 01/21/23  CBC with Differential/Platelet   Collection Time: 01/21/23  3:13 PM   Result Value Ref Range   WBC 4.1 3.4 - 10.8 x10E3/uL   RBC 4.87 3.77 - 5.28 x10E6/uL   Hemoglobin 13.0 11.1 - 15.9 g/dL   Hematocrit 16.1 09.6 - 46.6 %   MCV 81 79 - 97 fL   MCH 26.7 26.6 - 33.0 pg   MCHC 33.1 31.5 - 35.7 g/dL   RDW 04.5 40.9 - 81.1 %   Platelets 216 150 - 450 x10E3/uL   Neutrophils 52 Not Estab. %   Lymphs 38 Not Estab. %   Monocytes 6 Not Estab. %   Eos 3 Not Estab. %   Basos 1 Not Estab. %   Neutrophils Absolute 2.2 1.4 - 7.0 x10E3/uL   Lymphocytes Absolute 1.6 0.7 - 3.1 x10E3/uL   Monocytes Absolute 0.3 0.1 - 0.9 x10E3/uL   EOS (ABSOLUTE) 0.1 0.0 - 0.4 x10E3/uL   Basophils Absolute 0.0 0.0 - 0.2 x10E3/uL   Immature Granulocytes 0 Not Estab. %   Immature Grans (Abs) 0.0 0.0 - 0.1 x10E3/uL  Comprehensive metabolic panel   Collection Time: 01/21/23  3:13 PM  Result Value Ref Range   Glucose 88 70 - 99 mg/dL   BUN 9 6 - 20 mg/dL   Creatinine, Ser 9.14 0.57 - 1.00 mg/dL   eGFR 782 >95 AO/ZHY/8.65   BUN/Creatinine Ratio 12 9 - 23   Sodium 138 134 - 144 mmol/L   Potassium 4.1 3.5 - 5.2 mmol/L   Chloride 103 96 - 106 mmol/L   CO2 20 20 - 29 mmol/L   Calcium 9.5 8.7 - 10.2 mg/dL   Total Protein 6.9 6.0 - 8.5 g/dL   Albumin 4.6 4.0 - 5.0 g/dL   Globulin, Total 2.3 1.5 - 4.5 g/dL   Bilirubin Total 1.4 (H) 0.0 - 1.2 mg/dL   Alkaline Phosphatase 64 42 - 106 IU/L   AST 17 0 - 40 IU/L   ALT 12 0 - 32 IU/L  Lipid Panel w/o Chol/HDL Ratio   Collection Time: 01/21/23  3:13 PM  Result Value Ref Range   Cholesterol, Total 145 100 - 169 mg/dL   Triglycerides 45 0 - 89 mg/dL   HDL 84 >78 mg/dL   VLDL Cholesterol Cal 10 5 - 40 mg/dL   LDL Chol Calc (NIH) 51 0 - 109 mg/dL  TSH   Collection Time: 01/21/23  3:13 PM  Result Value Ref Range   TSH 0.837 0.450 - 4.500 uIU/mL  T4, free  Collection Time: 01/21/23  3:13 PM  Result Value Ref Range   Free T4 1.39 0.93 - 1.60 ng/dL  Thyroid peroxidase antibody   Collection Time: 01/21/23  3:13 PM  Result Value Ref  Range   Thyroperoxidase Ab SerPl-aCnc 11 0 - 26 IU/mL  VITAMIN D 25 Hydroxy (Vit-D Deficiency, Fractures)   Collection Time: 01/21/23  3:13 PM  Result Value Ref Range   Vit D, 25-Hydroxy 9.4 (L) 30.0 - 100.0 ng/mL      Assessment & Plan:   Problem List Items Addressed This Visit       Other   Possible pregnancy - Primary   Three positive urine tests at home.  G1P0 with LMP 06/26/23, placing her EDC around 04/01/24.  Will obtain blood work to further determine dates.  Recommend she start prenatal vitamin with folic acid and DHA.  Discussed that she can use Benadryl for sleep and TUMS for belching or heart burn.  We discussed that cramping can be normal, but if severe cramping or heavy bleeding to notify provider.      Relevant Orders   Ambulatory referral to Obstetrics / Gynecology   Beta hCG quant (ref lab)     Follow up plan: Return if symptoms worsen or fail to improve.

## 2023-08-04 ENCOUNTER — Encounter: Payer: Self-pay | Admitting: Nurse Practitioner

## 2023-08-04 LAB — BETA HCG QUANT (REF LAB): hCG Quant: 4474 m[IU]/mL

## 2023-08-04 NOTE — Progress Notes (Signed)
Contacted via MyChart   Good morning Brandi [name], your lab has returned and you are between 5-[redacted] weeks pregnant.  When ultrasound is done in future this will be able to further determine dates.  Let me know if you do not hear from OB/GYN. Keep being amazing!!  Thank you for allowing me to participate in your care.  I appreciate you. Kindest regards, Lizzie An

## 2023-08-20 ENCOUNTER — Telehealth: Payer: Self-pay

## 2023-08-20 NOTE — Telephone Encounter (Signed)
Copied from CRM [id number]. Topic: Clinical - Medical Advice >> Aug 20, 2023  3:42 PM Shelah Lewandowsky wrote: Reason for CRM: questions about pregnancy, please call [phone]

## 2023-08-20 NOTE — Telephone Encounter (Signed)
Returned call to patient however unable to leave VM as there was not one set up. If patient has questions it may be best for her to schedule an office visit (if having any symptoms) or virtual visit if she only has questions. If she has an OB she should also reach out to them.

## 2023-08-25 ENCOUNTER — Encounter: Payer: Self-pay | Admitting: Nurse Practitioner

## 2023-08-25 ENCOUNTER — Telehealth (INDEPENDENT_AMBULATORY_CARE_PROVIDER_SITE_OTHER): Payer: Medicaid Other | Admitting: Nurse Practitioner

## 2023-08-25 DIAGNOSIS — N898 Other specified noninflammatory disorders of vagina: Secondary | ICD-10-CM | POA: Diagnosis not present

## 2023-08-25 NOTE — Progress Notes (Signed)
 LMP 06/26/2023 (Exact Date)    Subjective:    Patient ID: Brandi [name], female    DOB: Aug 21, 2000, 23 y.o.   MRN: [id number]  HPI: Brandi [name] is a 23 y.o. female  Chief Complaint  Patient presents with   Vaginal Discharge    Patient states she has been having an increase in discharge over the last month.   Virtual Visit via Video Note  I connected with Brandi [name] on 08/25/23 at  8:20 AM EST by a video enabled telemedicine application and verified that I am speaking with the correct person using two identifiers.  Location: Patient: home Provider: work   I discussed the limitations of evaluation and management by telemedicine and the availability of in person appointments. The patient expressed understanding and agreed to proceed.  I discussed the assessment and treatment plan with the patient. The patient was provided an opportunity to ask questions and all were answered. The patient agreed with the plan and demonstrated an understanding of the instructions.   The patient was advised to call back or seek an in-person evaluation if the symptoms worsen or if the condition fails to improve as anticipated.  I provided 25 minutes of non-face-to-face time during this encounter.   Brandi Skiff, NP   VAGINAL DISCHARGE Continues to have vaginal discharge, currently pregnant. Duration: weeks Discharge description: mucous, white and watery with light yellow tint Pruritus: no Dysuria: no Malodorous: worse after sex Urinary frequency: no Fevers: no Abdominal pain: no  Sexual activity: monogamous History of sexually transmitted diseases: yes chlamydia in 2023 Recent antibiotic use: no Context: progressed some  Treatments attempted: none   Relevant past medical, surgical, family and social history reviewed and updated as indicated. Interim medical history since our last visit reviewed. Allergies and medications reviewed and  updated.  Review of Systems  Constitutional:  Negative for activity change, appetite change, diaphoresis, fatigue and fever.  Respiratory:  Negative for cough, chest tightness, shortness of breath and wheezing.   Cardiovascular:  Negative for chest pain, palpitations and leg swelling.  Gastrointestinal:  Negative for abdominal pain, constipation, diarrhea, nausea and vomiting.  Genitourinary:  Positive for vaginal discharge. Negative for dysuria, frequency, hematuria, urgency, vaginal bleeding and vaginal pain.  Psychiatric/Behavioral: Negative.      Per HPI unless specifically indicated above     Objective:    LMP 06/26/2023 (Exact Date)   Wt Readings from Last 3 Encounters:  08/03/23 152 lb 6.4 oz (69.1 kg)  01/21/23 147 lb 6.4 oz (66.9 kg) (78%, Z= 0.77)*  06/06/22 136 lb 12.8 oz (62.1 kg) (67%, Z= 0.45)*   * Growth percentiles are based on CDC (Girls, 2-20 Years) data.    Physical Exam Vitals and nursing note reviewed.  Constitutional:      General: She is awake. She is not in acute distress.    Appearance: She is well-developed. She is not ill-appearing.  HENT:     Head: Normocephalic.     Right Ear: Hearing normal.     Left Ear: Hearing normal.  Eyes:     General: Lids are normal.        Right eye: No discharge.        Left eye: No discharge.     Conjunctiva/sclera: Conjunctivae normal.  Pulmonary:     Effort: Pulmonary effort is normal. No accessory muscle usage or respiratory distress.  Musculoskeletal:     Cervical back: Normal range of motion.  Neurological:  Mental Status: She is alert and oriented to person, place, and time.  Psychiatric:        Attention and Perception: Attention normal.        Mood and Affect: Mood normal.        Behavior: Behavior normal. Behavior is cooperative.        Thought Content: Thought content normal.        Judgment: Judgment normal.     Results for orders placed or performed in visit on 08/03/23  Beta hCG quant (ref  lab)   Collection Time: 08/03/23  3:19 PM  Result Value Ref Range   hCG Quant 4,474 mIU/mL      Assessment & Plan:   Problem List Items Addressed This Visit       Other   Vaginal discharge - Primary   Acute for over a week.  No other symptoms presenting with this.  Currently pregnant.  She will come in for outpatient testing: wet prep, UA, and gonorrhea/chlamydia testing.  Will determine next steps to take after all testing returns and treat as needed.      Relevant Orders   WET PREP FOR TRICH, YEAST, CLUE   Urinalysis, Routine w reflex microscopic   GC/Chlamydia Probe Amp     Follow up plan: Return if symptoms worsen or fail to improve.

## 2023-08-25 NOTE — Patient Instructions (Signed)
 Vaginal Infection (Bacterial Vaginosis): What to Know  Bacterial vaginosis is an infection of the vagina. It happens when the balance of normal germs (bacteria) in the vagina changes. If you don't get treated, it can make it easier for you to get other infections from sex. These are called sexually transmitted infections (STIs). If you're pregnant, you need to get treated right away. This infection can cause a baby to be born early or at a low birth weight. What are the causes? This infection happens when too many harmful germs grow in the vagina. You can't get this infection from toilet seats, bedsheets, swimming pools, or things that touch your vagina. What increases the risk? Having sex with a new person or more than one person. Having sex without protection. Douching. Having an intrauterine device (IUD). Smoking. Using drugs or drinking alcohol. These can lead you to do risky things. Taking certain antibiotics. Being pregnant. What are the signs or symptoms? Some females have no symptoms. Symptoms may include: A gray or white discharge from your vagina. It can be watery or foamy. A fishy smell. This can happen after sex or during your menstrual period. Itching in and around your vagina. Burning or pain when you pee. How is this treated? This infection is treated with antibiotics. These may be given to you as: A pill. A cream for your vagina. A medicine that you put into your vagina (suppository). If the infection comes back, you may need more antibiotics. Follow these instructions at home: Medicines Take your medicines as told. Take or use your antibiotics as told. Do not stop using them even if you start to feel better. General instructions If the person you have sex with is a female, tell her that you have this infection. She will need to follow up with her doctor. Female partners don't need to be treated. Do not have sex until you finish treatment. Drink more fluids as  told. Keep your vagina and butt clean. Wash these areas with warm water each day. Wipe from front to back after you poop. If you're breastfeeding a baby, talk to your doctor if you should keep doing so during treatment. How is this prevented? Self-care Do not douche. Do not use deodorant sprays on your vagina. Wear cotton underwear. Do not wear tight pants and pantyhose, especially in the summer. Safe sex Use condoms the correct way and every time you have sex. Use dental dams to protect yourself during oral sex. Limit how many people you have sex with. Get tested for STIs. The person you have sex with should also get tested. Drugs and alcohol Do not smoke, vape, or use nicotine or tobacco. Do not use drugs. Limit the amount of alcohol you drink because it can lead you to do risky things. Where to find more information To learn more: Go to TonerPromos.no. Click Health Topics A-Z. Type "bacterial vaginosis" in the search bar. American Sexual Health Association (ASHA): ashasexualhealth.org U.S. Department of Health and [employer], Office on Women's Health: TravelLesson.ca Contact a doctor if: Your symptoms don't get better, even after treatment. You have more discharge or pain when you pee. You have a fever or chills. You have pain in your belly or in the area between your hips. You have pain during sex. You bleed from your vagina between menstrual periods. This information is not intended to replace advice given to you by your health care provider. Make sure you discuss any questions you have with your health care provider. Document  Revised: 12/03/2022 Document Reviewed: 12/03/2022 Elsevier Patient Education  2024 [employer].

## 2023-08-25 NOTE — Assessment & Plan Note (Signed)
 Acute for over a week.  No other symptoms presenting with this.  Currently pregnant.  She will come in for outpatient testing: wet prep, UA, and gonorrhea/chlamydia testing.  Will determine next steps to take after all testing returns and treat as needed.

## 2023-08-27 ENCOUNTER — Other Ambulatory Visit: Payer: Medicaid Other

## 2023-08-27 ENCOUNTER — Encounter: Payer: Self-pay | Admitting: Nurse Practitioner

## 2023-08-27 ENCOUNTER — Other Ambulatory Visit: Payer: Self-pay | Admitting: Nurse Practitioner

## 2023-08-27 DIAGNOSIS — N898 Other specified noninflammatory disorders of vagina: Secondary | ICD-10-CM | POA: Diagnosis not present

## 2023-08-27 LAB — URINALYSIS, ROUTINE W REFLEX MICROSCOPIC
Bilirubin, UA: NEGATIVE
Glucose, UA: NEGATIVE
Ketones, UA: NEGATIVE
Nitrite, UA: NEGATIVE
RBC, UA: NEGATIVE
Specific Gravity, UA: 1.02 (ref 1.005–1.030)
Urobilinogen, Ur: 0.2 mg/dL (ref 0.2–1.0)
pH, UA: 6 (ref 5.0–7.5)

## 2023-08-27 LAB — WET PREP FOR TRICH, YEAST, CLUE
Clue Cell Exam: POSITIVE — AB
Trichomonas Exam: NEGATIVE
Yeast Exam: NEGATIVE

## 2023-08-27 LAB — MICROSCOPIC EXAMINATION

## 2023-08-27 MED ORDER — METRONIDAZOLE 0.75 % VA GEL: 1.0000 | Freq: Every day | VAGINAL | 0 refills | Status: DC

## 2023-08-27 NOTE — Progress Notes (Signed)
 New OB Intake  I connected with  Brandi [name] on 08/28/23 at 11:15 AM EST by Video Visit and verified that I am speaking with the correct person using two identifiers. Nurse is located at [facility] and pt is located at home.  I explained I am completing New OB Intake today. We discussed her EDD of 04/01/24 that is based on LMP of 06/26/23. Pt is G1/P1. I reviewed her allergies, medications, Medical/Surgical/OB history, and appropriate screenings. There are no cats in the home. Based on history, this is a/an pregnancy uncomplicated . Her obstetrical history is significant for  N/A .  Patient Active Problem List   Diagnosis Date Noted   Supervision of other normal pregnancy, antepartum 08/28/2023   Vaginal discharge 08/25/2023   Vitamin D deficiency 01/21/2023   Situational anxiety 01/21/2023   Low TSH level 06/06/2022   Possible pregnancy 01/09/2022   Neurogenic bowel 11/07/2017    Concerns addressed today: None  Delivery Plans:  Plans to deliver at [facility].  Anatomy US Explained first scheduled [place] will be on 09/08/23 and Anatomy US will be done at 20 weeks of gestational age.  Labs Discussed genetic screening with patient. Patient desires genetic testing to be drawn at new OB visit. Discussed possible labs to be drawn at new OB appointment.  COVID Vaccine Patient has not had COVID vaccine.   Social Determinants of Health Food Insecurity: denies food insecurity  Transportation: Patient denies transportation needs.  First visit review I reviewed new OB appt with pt. I explained she will have blood work and pap smear/pelvic exam if indicated. Explained pt will be seen by Tresea Mall, CNM at first visit; encounter routed to appropriate provider.   Donnetta Hail, [facility] 08/28/2023  11:42 AM

## 2023-08-27 NOTE — Progress Notes (Signed)
 Contacted via MyChart   Good afternoon Brandi [name], your vaginal swab returned and is showing bacterial vaginosis.  Waiting on urine testing.  Am sending in Flagyl vaginal cream to see if we can get this better.  This is safe to use in pregnancy.  Any questions?

## 2023-08-28 ENCOUNTER — Telehealth: Payer: Medicaid Other

## 2023-08-28 DIAGNOSIS — Z3A01 Less than 8 weeks gestation of pregnancy: Secondary | ICD-10-CM

## 2023-08-28 DIAGNOSIS — O0990 Supervision of high risk pregnancy, unspecified, unspecified trimester: Secondary | ICD-10-CM | POA: Insufficient documentation

## 2023-08-28 DIAGNOSIS — Z3480 Encounter for supervision of other normal pregnancy, unspecified trimester: Secondary | ICD-10-CM

## 2023-08-28 DIAGNOSIS — Z3687 Encounter for antenatal screening for uncertain dates: Secondary | ICD-10-CM

## 2023-08-28 NOTE — Patient Instructions (Signed)
 First Trimester of Pregnancy  The first trimester of pregnancy starts on the first day of your last monthly period until the end of week 13. This is months 1 through 3 of pregnancy. A week after a sperm fertilizes an egg, the egg will implant into the wall of the uterus and begin to develop into a baby. Body changes during your first trimester Your body goes through many changes during pregnancy. The changes usually return to normal after your baby is born. Physical changes Your breasts may grow larger and may hurt. The area around your nipples may get darker. Your periods will stop. Your hair and nails may grow faster. You may pee more often. Health changes You may tire easily. Your gums may bleed and may be sensitive when you brush and floss. You may not feel hungry. You may have heartburn. You may throw up or feel like you may throw up. You may want to eat some foods, but not others. You may have headaches. You may have trouble pooping (constipation). Other changes Your emotions may change from day to day. You may have more dreams. Follow these instructions at home: Medicines Talk to your health care provider if you're taking medicines. Ask if the medicines are safe to take during pregnancy. Your provider may change the medicines that you take. Do not take any medicines unless told to by your provider. Take a prenatal vitamin that has at least 600 micrograms (mcg) of folic acid. Do not use herbal medicines, illegal substances, or medicines that are not approved by your provider. Eating and drinking While you're pregnant your body needs extra food for your growing baby. Talk with your provider about what to eat while pregnant. Activity Most women are able to exercise during pregnancy. Exercises may need to change as your pregnancy goes on. Talk to your provider about your activities and exercise routines. Relieving pain and discomfort Wear a good, supportive bra if your breasts  hurt. Rest with your legs raised if you have leg cramps or low back pain. Safety Wear your seatbelt at all times when you're in a car. Talk to your provider if someone hits you, hurts you, or yells at you. Talk with your provider if you're feeling sad or have thoughts of hurting yourself. Lifestyle Certain things can be harmful while you're pregnant. Follow these rules: Do not use hot tubs, steam rooms, or saunas. Do not douche. Do not use tampons or scented pads. Do not drink alcohol,smoke, vape, or use products with nicotine or tobacco in them. If you need help quitting, talk with your provider. Avoid cat litter boxes and soil used by cats. These things carry germs that can cause harm to your pregnancy and your baby. General instructions Keep all follow-up visits. It helps you and your unborn baby stay as healthy as possible. Write down your questions. Take them to your visits. Your provider will: Talk with you about your overall health. Give you advice or refer you to specialists who can help with different needs, including: Prenatal education classes. Mental health and counseling. Foods and healthy eating. Ask for help if you need help with food. Call your dentist and ask to be seen. Brush your teeth with a soft toothbrush. Floss gently. Where to find more information American Pregnancy Association: americanpregnancy.org [employer] of [employer]stetricians and Gynecologists: acog.org Office on [employer] Health: TravelLesson.ca Contact a health care provider if: You feel dizzy, faint, or have a fever. You vomit or have watery poop (diarrhea) for 2  days or more. You have abnormal discharge or bleeding from your vagina. You have pain when you pee or your pee smells bad. You have cramps, pain, or pressure in your belly area. Get help right away if: You have trouble breathing or chest pain. You have any kind of injury, such as from a fall or a car crash. These symptoms may be an  emergency. Get help right away. Call 911. Do not wait to see if the symptoms will go away. Do not drive yourself to the hospital. This information is not intended to replace advice given to you by your health care provider. Make sure you discuss any questions you have with your health care provider. Document Revised: 03/19/2023 Document Reviewed: 10/17/2022 Elsevier Patient Education  2024 Elsevier Inc.   Common Medications Safe in Pregnancy  Acne:      Constipation:  Benzoyl Peroxide     Colace  Clindamycin      Dulcolax Suppository  Topica Erythromycin     Fibercon  Salicylic Acid      Metamucil         Miralax AVOID:        Senakot   Accutane    Cough:  Retin-A       Cough Drops  Tetracycline      Phenergan w/ Codeine if Rx  Minocycline      Robitussin (Plain & DM)  Antibiotics:     Crabs/Lice:  Ceclor       RID  Cephalosporins    AVOID:  E-Mycins      Kwell  Keflex  Macrobid/Macrodantin   Diarrhea:  Penicillin      Kao-Pectate  Zithromax      Imodium AD         PUSH FLUIDS AVOID:       Cipro     Fever:  Tetracycline      Tylenol (Regular or Extra  Minocycline       Strength)  Levaquin      Extra Strength-Do not          Exceed 8 tabs/24 hrs Caffeine:        200mg /day (equiv. To 1 cup of coffee or  approx. 3 12 oz sodas)         Gas: Cold/Hayfever:       Gas-X  Benadryl      Mylicon  Claritin       Phazyme  **Claritin-D        Chlor-Trimeton    Headaches:  Dimetapp      ASA-Free Excedrin  Drixoral-Non-Drowsy     Cold Compress  Mucinex (Guaifenasin)     Tylenol (Regular or Extra  Sudafed/Sudafed-12 Hour     Strength)  **Sudafed PE Pseudoephedrine   Tylenol Cold & Sinus     Vicks Vapor Rub  Zyrtec  **AVOID if Problems With Blood Pressure         Heartburn: Avoid lying down for at least 1 hour after meals  Aciphex      Maalox     Rash:  Milk of Magnesia     Benadryl    Mylanta       1% Hydrocortisone Cream  Pepcid  Pepcid Complete   Sleep  Aids:  Prevacid      Ambien   Prilosec       Benadryl  Rolaids       Chamomile Tea  Tums (Limit 4/day)     Unisom  Tylenol PM         Warm milk-add vanilla or  Hemorrhoids:       Sugar for taste  Anusol/Anusol H.C.  (RX: Analapram 2.5%)  Sugar Substitutes:  Hydrocortisone OTC     Ok in moderation  Preparation H      Tucks        Vaseline lotion applied to tissue with wiping    Herpes:     Throat:  Acyclovir      Oragel  Famvir  Valtrex     Vaccines:         Flu Shot Leg Cramps:       *Gardasil  Benadryl      Hepatitis A         Hepatitis B Nasal Spray:       Pneumovax  Saline Nasal Spray     Polio Booster         Tetanus Nausea:       Tuberculosis test or PPD  Vitamin B6 25 mg TID   AVOID:    Dramamine      *Gardasil  Emetrol       Live Poliovirus  Ginger Root 250 mg QID    MMR (measles, mumps &  High Complex Carbs @ Bedtime    rebella)  Sea Bands-Accupressure    Varicella (Chickenpox)  Unisom 1/2 tab TID     *No known complications           If received before Pain:         Known pregnancy;   Darvocet       Resume series after  Lortab        Delivery  Percocet    Yeast:   Tramadol      Femstat  Tylenol 3      Gyne-lotrimin  Ultram       Monistat  Vicodin           MISC:         All Sunscreens           Hair Coloring/highlights          Insect Repellant's          (Including DEET)         Mystic Tans   Commonly Asked Questions During Pregnancy   Cats: A parasite can be excreted in cat feces.  To avoid exposure you need to have another person empty the little box.  If you must empty the litter box you will need to wear gloves.  Wash your hands after handling your cat.  This parasite can also be found in raw or undercooked meat so this should also be avoided.  Colds, Sore Throats, Flu: Please check your medication sheet to see what you can take for symptoms.  If your symptoms are unrelieved by these medications please call the office.  Dental Work: Most  any dental work Agricultural consultant recommends is permitted.  X-rays should only be taken during the first trimester if absolutely necessary.  Your abdomen should be shielded with a lead apron during all x-rays.  Please notify your provider prior to receiving any x-rays.  Novocaine is fine; gas is not recommended.  If your dentist requires a note from [place] prior to dental work please call the office and we will provide one for you.  Exercise: Exercise is an important part of staying healthy during your pregnancy.  You may continue most exercises you were accustomed to prior to pregnancy.  Later in your pregnancy you will most likely notice you have difficulty with activities requiring balance like riding a bicycle.  It is important that you listen to your body and avoid activities that put you at a higher risk of falling.  Adequate rest and staying well hydrated are a must!  If you have questions about the safety of specific activities ask your provider.    Exposure to Children with illness: Try to avoid obvious exposure; report any symptoms to [place] when noted,  If you have chicken pos, red measles or mumps, you should be immune to these diseases.   Please do not take any vaccines while pregnant unless you have checked with your OB provider.  Fetal Movement: After 28 weeks we recommend you do "kick counts" twice daily.  Lie or sit down in a calm quiet environment and count your baby movements "kicks".  You should feel your baby at least 10 times per hour.  If you have not felt 10 kicks within the first hour get up, walk around and have something sweet to eat or drink then repeat for an additional hour.  If count remains less than 10 per hour notify your provider.  Fumigating: Follow your pest control agent's advice as to how long to stay out of your home.  Ventilate the area well before re-entering.  Hemorrhoids:   Most over-the-counter preparations can be used during pregnancy.  Check your medication to see what is  safe to use.  It is important to use a stool softener or fiber in your diet and to drink lots of liquids.  If hemorrhoids seem to be getting worse please call the office.   Hot Tubs:  Hot tubs Jacuzzis and saunas are not recommended while pregnant.  These increase your internal body temperature and should be avoided.  Intercourse:  Sexual intercourse is safe during pregnancy as long as you are comfortable, unless otherwise advised by your provider.  Spotting may occur after intercourse; report any bright red bleeding that is heavier than spotting.  Labor:  If you know that you are in labor, please go to the hospital.  If you are unsure, please call the office and let us help you decide what to do.  Lifting, straining, etc:  If your job requires heavy lifting or straining please check with your provider for any limitations.  Generally, you should not lift items heavier than that you can lift simply with your hands and arms (no back muscles)  Painting:  Paint fumes do not harm your pregnancy, but may make you ill and should be avoided if possible.  Latex or water based paints have less odor than oils.  Use adequate ventilation while painting.  Permanents & Hair Color:  Chemicals in hair dyes are not recommended as they cause increase hair dryness which can increase hair loss during pregnancy.  " Highlighting" and permanents are allowed.  Dye may be absorbed differently and permanents may not hold as well during pregnancy.  Sunbathing:  Use a sunscreen, as skin burns easily during pregnancy.  Drink plenty of fluids; avoid over heating.  Tanning Beds:  Because their possible side effects are still unknown, tanning beds are not recommended.  Ultrasound Scans:  Routine ultrasounds are performed at approximately 20 weeks.  You will be able to see your baby's general anatomy an if you would like to know the gender this can usually be determined as well.  If it is questionable when you conceived you may  also  receive an ultrasound early in your pregnancy for dating purposes.  Otherwise ultrasound exams are not routinely performed unless there is a medical necessity.  Although you can request a scan we ask that you pay for it when conducted because insurance does not cover " patient request" scans.  Work: If your pregnancy proceeds without complications you may work until your due date, unless your physician or employer advises otherwise.  Round Ligament Pain/Pelvic Discomfort:  Sharp, shooting pains not associated with bleeding are fairly common, usually occurring in the second trimester of pregnancy.  They tend to be worse when standing up or when you remain standing for long periods of time.  These are the result of pressure of certain pelvic ligaments called "round ligaments".  Rest, Tylenol and heat seem to be the most effective relief.  As the womb and fetus grow, they rise out of the pelvis and the discomfort improves.  Please notify the office if your pain seems different than that described.  It may represent a more serious condition.

## 2023-08-31 ENCOUNTER — Other Ambulatory Visit: Payer: Self-pay | Admitting: Nurse Practitioner

## 2023-08-31 DIAGNOSIS — A749 Chlamydial infection, unspecified: Secondary | ICD-10-CM

## 2023-08-31 LAB — GC/CHLAMYDIA PROBE AMP: Neisseria Gonorrhoeae by PCR: NEGATIVE

## 2023-08-31 MED ORDER — AZITHROMYCIN 500 MG PO TABS: 1000.0000 mg | ORAL_TABLET | Freq: Once | ORAL | 0 refills | Status: DC

## 2023-08-31 NOTE — Progress Notes (Signed)
 Contacted via MyChart -- however please call to ensure she received results and will need health department report.  Thanks.  Also will need 3 month lab only visit to retest.   Good afternoon Brandi [name], your urine testing returned positive for chlamydia again.  I am sending in Azithromycin 1 G to take times one dose.  This is okay in pregnancy.  We will then need to retest in 3 months.  Any questions?  Please ensure your partner also receives treatment. Keep being stellar!!  Thank you for allowing me to participate in your care.  I appreciate you. Kindest regards, Karrie Fluellen

## 2023-09-01 MED ORDER — AZITHROMYCIN 500 MG PO TABS: 1000.0000 mg | ORAL_TABLET | Freq: Once | ORAL | 0 refills | Status: AC

## 2023-09-01 NOTE — Addendum Note (Signed)
 Addended by: Aura Dials T on: 09/01/2023 08:10 AM   Modules accepted: Orders

## 2023-09-08 ENCOUNTER — Ambulatory Visit: Payer: Medicaid Other

## 2023-09-08 DIAGNOSIS — Z3687 Encounter for antenatal screening for uncertain dates: Secondary | ICD-10-CM

## 2023-09-08 DIAGNOSIS — Z3A09 9 weeks gestation of pregnancy: Secondary | ICD-10-CM | POA: Diagnosis not present

## 2023-09-14 ENCOUNTER — Other Ambulatory Visit (HOSPITAL_COMMUNITY)
Admission: RE | Admit: 2023-09-14 | Discharge: 2023-09-14 | Disposition: A | Discharge status: Home / Self Care | Source: Ambulatory Visit | Attending: Advanced Practice Midwife | Admitting: Advanced Practice Midwife

## 2023-09-14 ENCOUNTER — Encounter: Payer: Self-pay | Admitting: Advanced Practice Midwife

## 2023-09-14 ENCOUNTER — Telehealth: Payer: Self-pay

## 2023-09-14 ENCOUNTER — Ambulatory Visit (INDEPENDENT_AMBULATORY_CARE_PROVIDER_SITE_OTHER): Payer: Medicaid Other | Admitting: Advanced Practice Midwife

## 2023-09-14 VITALS — BP 101/69 | HR 90 | Wt 153.0 lb

## 2023-09-14 DIAGNOSIS — Z130 Encounter for screening for diseases of the blood and blood-forming organs and certain disorders involving the immune mechanism: Secondary | ICD-10-CM | POA: Diagnosis not present

## 2023-09-14 DIAGNOSIS — O283 Abnormal ultrasonic finding on antenatal screening of mother: Secondary | ICD-10-CM

## 2023-09-14 DIAGNOSIS — Z113 Encounter for screening for infections with a predominantly sexual mode of transmission: Secondary | ICD-10-CM | POA: Diagnosis not present

## 2023-09-14 DIAGNOSIS — Z3401 Encounter for supervision of normal first pregnancy, first trimester: Secondary | ICD-10-CM

## 2023-09-14 DIAGNOSIS — Z1379 Encounter for other screening for genetic and chromosomal anomalies: Secondary | ICD-10-CM

## 2023-09-14 DIAGNOSIS — Z0283 Encounter for blood-alcohol and blood-drug test: Secondary | ICD-10-CM | POA: Diagnosis not present

## 2023-09-14 DIAGNOSIS — Z3A11 11 weeks gestation of pregnancy: Secondary | ICD-10-CM

## 2023-09-14 DIAGNOSIS — Z0184 Encounter for antibody response examination: Secondary | ICD-10-CM | POA: Diagnosis not present

## 2023-09-14 NOTE — Progress Notes (Signed)
 [facility]  New Obstetric Patient H&P    Chief Complaint: "Desires prenatal care"   History of Present Illness: Patient is a 23 y.o. G1P0 Not Hispanic or Latino female, presents with amenorrhea and positive home pregnancy test. Patient's last menstrual period was 06/26/2023 (exact date). and based on her  LMP, her EDD is Estimated Date of Delivery: 04/01/24 and her EGA is [redacted]w[redacted]d. Cycles are 4 days, regular, and occur approximately every : 28 days.    She had a urine pregnancy test which was positive 6 week(s)  ago. Her last menstrual period was normal and lasted for  4 day(s). Since her LMP she claims she has experienced breast tenderness, fatigue, nausea, vomiting. She denies vaginal bleeding. Her past medical history is contributory for repair of umbilical hernia as a baby and transverse myelitis. Hasn't had treatment in years. This is her first pregnancy.  Since her LMP, she admits to the use of tobacco products  no She claims she has gained  8  pounds since the start of her pregnancy.  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 no  Genetic Screening/Teratology Counseling: (Includes patient, baby's father, or anyone in either family with:)   1. Patient's age >/= 81 at [facility]  no 2. Thalassemia ([place], [place], Mediterranean, or Asian background): MCV<80  no 3. Neural tube defect (meningomyelocele, spina bifida, anencephaly)  no 4. Congenital heart defect  no  5. Down syndrome  no 6. Tay-Sachs (Jewish, [place]  no 7. Canavan's Disease  no 8. Sickle cell disease or trait (African)  no  9. Hemophilia or other blood disorders  no  10. Muscular dystrophy  no  11. Cystic fibrosis  no  12. Huntington's Chorea  no  13. Mental  retardation/autism  no 14. Other inherited genetic or chromosomal disorder  no 15. Maternal metabolic disorder (DM, PKU, etc)  no 16. Patient or FOB with a child with a birth defect not listed above no  16a. Patient or FOB with a birth defect themselves no 17. Recurrent pregnancy loss, or stillbirth  no  18. Any medications since LMP other than prenatal vitamins (include vitamins, supplements, OTC meds, drugs, alcohol)  no 19. Any other genetic/environmental exposure to discuss  no  Infection History:   1. Lives with someone with TB or TB exposed  no  2. Patient or partner has history of genital herpes  no 3. Rash or viral illness since LMP  no 4. History of STI (GC, CT, HPV, syphilis, HIV)  remote hx of chlamydia 5. History of recent travel :  no  Other pertinent information:  3 cm complex area seen in gestational sac on dating scan. Follow up with MFM scheduled.   Review of Systems:10 point review of systems negative unless otherwise noted in HPI  Past Medical History:  Patient Active Problem List   Diagnosis Date Noted Date Diagnosed   Supervision of other normal pregnancy, antepartum 08/28/2023      Clinical Staff Provider  Office Location  [facility] Dating  04/01/2024, by Last Menstrual Period  Language  English Anatomy US    Flu Vaccine  03/31/23 Genetic Screen  NIPS:   TDaP vaccine   Offer Hgb  A1C or  GTT Early : Third trimester :   Covid Declined   LAB RESULTS   Rhogam     Blood Type     RSV N/A Antibody    Feeding Plan Breastfeed & Formula Rubella    Contraception No RPR     Circumcision Yes HBsAg     Pediatrician  Children[facility] P[facility] HIV    Support Person FOB & Pt's Mom Varicella    Prenatal Classes Yes GBS  (For PCN allergy, check sensitivities)     Hep C     BTL Consent  Pap No results found for: "DIAGPAP"  VBAC Consent  Hgb Electro      CF      SMA             Vaginal discharge 08/25/2023    Vitamin D deficiency 01/21/2023     9.4 on labs 01/22/23     Situational anxiety 01/21/2023    Low TSH level 06/06/2022    Neurogenic bowel 11/07/2017 12/29/2021    Past Surgical History:  Past Surgical History:  Procedure Laterality Date   HERNIA REPAIR      Gynecologic History: Patient's last menstrual period was 06/26/2023 (exact date).  Obstetric History: G1P0  Family History:  History reviewed. No pertinent family history.  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)  Tobacco Use   Smoking status: Never   Smokeless tobacco: Never  Vaping Use   Vaping status: Former  Substance and Sexual Activity   Alcohol use: Not Currently   Drug use: Not Currently    Types: Marijuana   Sexual activity: Not Currently    Partners: Male    Birth control/protection: None  Other Topics Concern   Not on file  Social History Narrative   Not on file   Social Drivers of Health   Financial Resource Strain: Low Risk  (08/28/2023)   Overall Financial Resource Strain (CARDIA)    Difficulty of Paying Living Expenses: Not very hard  Food Insecurity: No Food Insecurity (08/28/2023)   Hunger Vital Sign    Worried About Running Out of Food in the Last Year: Never true    Ran Out of Food in the Last Year: Never true  Transportation Needs: No Transportation Needs (08/28/2023)   PRAPARE - Transportation    Lack of Transportation (Medical): No    Lack of Transportation (Non-Medical): No  Physical Activity: Insufficiently Active (08/28/2023)   Exercise Vital Sign    Days of Exercise per Week: 7 days    Minutes of Exercise per Session: 10 min  Stress: No Stress Concern Present (08/28/2023)   [employer] of Occupational Health - Occupational Stress Questionnaire    Feeling of Stress : Not at all   Social Connections: Moderately Isolated (08/28/2023)   Social Connection and Isolation Panel [NHANES]    Frequency of Communication with Friends and Family: More than three times a week    Frequency of Social Gatherings with Friends and Family: More than three times a week    Attends Religious Services: 1 to 4 times per year    Active Member of [employer] or Organizations: No    Attends Banker Meetings: Never  Marital Status: Never married  Intimate Partner Violence: Not At Risk (08/28/2023)   Humiliation, Afraid, Rape, and Kick questionnaire    Fear of Current or Ex-Partner: No    Emotionally Abused: No    Physically Abused: No    Sexually Abused: No    Allergies:  Allergies  Allergen Reactions   Plum Pulp Hives    Oral reaction    Apple Juice     Patient states she is allergic to any apple products   Pork Allergy     Medications: Prior to Admission medications   Medication Sig Start Date End Date Taking? Authorizing Provider  Prenatal Vit-Fe Fumarate-FA (PRENATAL PO) Take by mouth.   Yes [provider]    Physical Exam Vitals: Blood pressure 101/69, pulse 90, weight 153 lb (69.4 kg), last menstrual period 06/26/2023.  General: NAD HEENT: normocephalic, anicteric Thyroid: no enlargement, no palpable nodules Pulmonary: No increased work of breathing, CTAB Cardiovascular: RRR, distal pulses 2+ Abdomen: NABS, soft, non-tender, non-distended.  Umbilicus with previous hernia repair.  No evidence of hernia. FHTs by doppler 169 Genitourinary: deferred for self swab aptima/no PAP Extremities: no edema, erythema, or tenderness Neurologic: Grossly intact Psychiatric: mood appropriate, affect full   The following were addressed during this visit:  Breastfeeding Education - Early initiation of breastfeeding    Comments: Keeps milk supply adequate, helps contract uterus and slow bleeding, and early milk is the perfect first food and is easy to  digest.   - The importance of exclusive breastfeeding    Comments: Provides antibodies, Lower risk of breast and ovarian cancers, and type-2 diabetes,Helps your body recover, Reduced chance of SIDS.   - Risks of giving your baby anything other than breast milk if you are breastfeeding    Comments: Make the baby less content with breastfeeds, may make my baby more susceptible to illness, and may reduce my milk supply.   - The importance of early skin-to-skin contact    Comments:  Keeps baby warm and secure, helps keep baby's blood sugar up and breathing steady, easier to bond and breastfeed, and helps calm baby.  - Rooming-in on a 24-hour basis    Comments: Easier to learn baby's feeding cues, easier to bond and get to know each other, and encourages milk production.   - Feeding on demand or baby-led feeding    Comments: Helps prevent breastfeeding complications, helps bring in good milk supply, prevents under or overfeeding, and helps baby feel content and satisfied   - Frequent feeding to help assure optimal milk production    Comments: Making a full supply of milk requires frequent removal of milk from breasts, infant will eat 8-12 times in 24 hours, if separated from infant use breast massage, hand expression and/ or pumping to remove milk from breasts.   - Effective positioning and attachment    Comments: Helps my baby to get enough breast milk, helps to produce an adequate milk supply, and helps prevent nipple pain and damage   - Exclusive breastfeeding for the first 6 months    Comments: Builds a healthy milk supply and keeps it up, protects baby from sickness and disease, and breastmilk has everything your baby needs for the first 6 months.    Assessment: 23 y.o. G1P0 at [redacted]w[redacted]d presenting to initiate prenatal care  Plan: 1) Avoid alcoholic beverages. 2) Patient encouraged not to smoke.  3) Discontinue the use of all non-medicinal drugs and chemicals.  4) Take prenatal  vitamins daily.  5)  Nutrition, food safety (fish, cheese advisories, and high nitrite foods) and exercise discussed. 6) Hospital and practice style discussed with cross coverage system.  7) Genetic Screening, such as with 1st Trimester Screening, cell free fetal DNA, AFP testing, and Ultrasound, as well as with amniocentesis and CVS as appropriate, is discussed with patient. At the conclusion of today's visit patient requested genetic testing 8) Patient is asked about travel to areas at risk for the Zika virus, and counseled to avoid travel and exposure to mosquitoes or sexual partners who may have themselves been exposed to the virus. Testing is discussed, and will be ordered as appropriate.  9) NOB labs today 10) Follow up with MFM regarding finding on dating scan 11) Return to clinic in 4 weeks for ROB   Tresea Mall, CNM  Ob/Gyn [facility] 09/14/2023 5:30 PM

## 2023-09-14 NOTE — Patient Instructions (Signed)
 Prenatal Care Prenatal care is health care you get when pregnant. It helps you and your unborn baby stay as healthy as possible. Start prenatal care early in your pregnancy and continue to go to visits during your pregnancy. Prenatal care may be given by a midwife, a family practice doctor, a Publishing rights manager, physician assistant, or a childbirth and pregnancy doctor. What are the benefits of prenatal care? In prenatal care, your health care provider will get to know your medical history. You'll be checked for conditions that might affect you and your baby. Prenatal care will: Lower the risk for problems as your child grows. Lower certain risks for your baby, especially the risk that: Your child may be born early. Your child will have a low weight at birth. What can I expect at the first prenatal care visit? Your first visit will likely be the longest. You should ask to be seen as soon as you know you're pregnant. The first visit is a good time to talk about any questions or concerns. Make a list of questions to ask your provider at your visits. Medical history At your visit, you and your provider will talk about your medical history, including: Your family's medical history and the medical history of the baby's father. Any past pregnancies and long-term (chronic) health conditions. Any surgeries or procedures you have had. All medicines you're taking. Tell them if you're taking herbs or supplements too. Any tobacco, alcohol, or drug use. Other problems that may harm you and your baby. Tell them if: You need food or housing. You have been around chemicals or radiation. Your partner yells at you, hits you, or hurts you. Tests and screenings Your provider will: Do a physical exam, including a pelvic and breast exam. Do tests to check for: Urinary tract infection (UTI). Sexually transmitted infections (STIs). Low iron levels in your blood. This is called anemia. Blood type and certain  proteins on red blood cells called Rh antibodies. Infections and immunity to viruses, such as hepatitis B and rubella. HIV. Ask your provider if you need to be checked for genetic diseases. Tips about staying healthy Your provider will also give you information about how to keep yourself and your baby healthy, including: Nutrition, vitamins, and food safety. Physical activity. How to treat some problems, such as morning sickness. How to avoid infections and substances that may harm your baby. Caring for your teeth. Work and travel. Problems that require you to call your provider. How often will I have prenatal care visits? After your first prenatal care visit, you will have regular visits throughout your pregnancy. You may visit your provider as follows: Up to week 28 of pregnancy: once every 4 weeks. 28-36 weeks: once every 2 weeks. After 36 weeks: every week until delivery. Some people may have more visits. Others may have fewer. It all depends on your health and that of your baby. Keep all prenatal visits. This is one way for you and your baby to stay as healthy as possible. What happens during routine prenatal care visits? Your provider will: Check your weight and blood pressure. Check your baby's heart sounds. Ask questions about your diet, exercise, sleeping patterns, and whether you can feel the baby move. Ask about any pregnancy symptoms you're having and how you're dealing with them. Tell your provider if: You throw up or feel like you may throw up. You have discharge or you bleed from your vagina. You have trouble pooping (constipation). You have swelling, headaches, or trouble  seeing. You are very tired, or you feel sad and anxious all the time. You have discomfort, including back pain or pain in the pelvis. Tell you problems to watch for during your pregnancy, including signs of labor. Measure the height of your uterus in your belly. This is called fundal height. What  tests might I have during prenatal care visits? You may have blood, urine, and imaging tests. These may include: Urine tests to check for blood sugar, protein, or signs of infection. Genetic testing. Ultrasounds to check your baby's growth, development, and well-being. Your baby may also be checked for congenital conditions. Glucose tests to check for gestational diabetes. This is a form of diabetes that a person can get when pregnant. A test to check for group B strep (GBS) infection. What else can I expect during prenatal care visits? Your provider may give you some vaccines. Getting certain vaccines during pregnancy can protect your baby after birth. These may include: A flu shot. Tdap (tetanus, diphtheria, pertussis) vaccine. A COVID-19 vaccine. A RSV vaccine. Later in your pregnancy, your provider may talk to you about: Childbirth and childbirth classes. Breastfeeding and breastfeeding classes. Birth control after your baby is born. Where to find more information Office on Women's Health: TravelLesson.ca American Pregnancy Association: americanpregnancy.org March of Dimes: marchofdimes.org This information is not intended to replace advice given to you by your health care provider. Make sure you discuss any questions you have with your health care provider. Document Revised: 10/20/2022 Document Reviewed: 10/20/2022 Elsevier Patient Education  2024 [employer] Inc.Exercise During Pregnancy Exercise is an important part of being healthy for people of all ages. Exercise helps your heart and lungs work well. Exercise also: Helps you stay strong and flexible. Helps you keep a healthy body weight. Boosts your energy levels and improves your mood. You should try to exercise regularly during pregnancy. Exercise routines may need to change later in your pregnancy. In rare cases, certain medical problems in your pregnancy may limit the exercise you can do during pregnancy. Your health care  provider will give you information on what exercises will work for you. How does exercise help during pregnancy? Along with staying strong and flexible, exercising during pregnancy can help: Keep strength in muscles that are used during labor and birth. Control weight gain. Speed up your recovery after giving birth. Reduce the need for insulin if you get diabetes during pregnancy. Decrease low back pain. Lower the risk for depression. Lower the risk of cesarean delivery. Treat trouble pooping (constipation). How does exercise affect my baby? Exercise can help you have a healthy pregnancy. Exercise does not cause your baby to be born early. It will not cause your baby to weigh less at birth. What exercises can I do? Many exercises are safe for you to do during pregnancy. Do a variety of exercises that safely increase your heart and breathing rates and help you build and maintain muscle strength. Do exercises as told by your provider. Your provider may recommend: Walking. Swimming. Water aerobics. Riding a stationary bike. Modified yoga or Pilates. Tell your instructor that you're pregnant. Avoid overstretching. Avoid lying on your back for long periods of time. Resistance exercises with weights or elastic bands. Running or jogging. Choose this type of exercise only if: You ran or jogged regularly before your pregnancy. You can run or jog and still talk in full sentences. What exercises should I avoid? You may be told to limit high-intensity exercise depending on your level of fitness and if  you exercised regularly before you became pregnant. You can tell that you're exercising at a high intensity if you're breathing much harder and faster and can't hold a conversation while exercising. You may be told to: Avoid jogging or running, unless you jogged or ran regularly before you became pregnant. Do not run or jog so fast that you're unable to have a conversation. Avoid activities that put you  at risk for falling on your belly or getting hit in the belly. Some of these are: Downhill skiing. Rock climbing. Cycling and gymnastics. Horseback riding. Surfing and waterskiing. Contact sports. Avoid scuba diving. Avoid skydiving. Avoid activities that take place in a room that's heated to high temperatures, such as hot yoga or hot Pilates. How do I exercise in a safe way?  Start slowly. Ask your provider to recommend the types of exercise that are safe for you. Avoid overheating. Do not exercise in very high temperatures or hot rooms. Avoid hot yoga or hot Pilates. Avoid standing still or lying flat on your back as much as you can. Avoid losing too much fluid (dehydration). Drink more fluids as told. Drink before, during, and after you exercise. Avoid overstretching. Because of hormone changes during pregnancy, it's easy to overstretch muscles, tendons, and ligaments. Ligaments are the tissues that connect bones to each other. Do not exercise to lose weight. Do not exercise at more than 6,000 feet above sea level (high elevation) if you don't live at that elevation. Tips and recommendations Wear loose-fitting, breathable clothes. Wear a sports bra to support your breasts. Exercise on most days or all days of the week. Try to exercise for 30 minutes a day, 5 days a week. If problems come up during your pregnancy, you provider may tell you to limit some exercises or to exercise less. If you have concerns, ask your provider. If you actively exercised before your pregnancy, your provider may tell you to continue to do moderate-intensity to high-intensity exercise. If you're just starting to exercise or didn't exercise much before your pregnancy, your provider may tell you to do low-intensity to moderate-intensity exercise. Questions to ask your health care provider Is exercise safe for me? What are signs that I should stop exercising? Does my health condition mean that I should not  exercise during pregnancy? When should I avoid exercising during pregnancy? Stop exercising and contact a health care provider if: You have any unusual symptoms, such as: Mild contractions or cramps in the belly. Dizziness that does not go away when you rest. Headache. Pain and swelling of your calves. Bleeding or fluid leaking from your vagina. Stop exercising and get help right away if: You have: Chest pain. Shortness of breath. Sudden, severe pain in your low back or your belly. Regular, painful contractions before 37 weeks of pregnancy. These symptoms may be an emergency. Call 911 right away. Do not wait to see if the symptoms will go away. Do not drive yourself to the hospital. This information is not intended to replace advice given to you by your health care provider. Make sure you discuss any questions you have with your health care provider. Document Revised: 02/09/2023 Document Reviewed: 02/09/2023 Elsevier Patient Education  2024 Elsevier Inc.Eating Plan for Pregnant Women While you are pregnant, your body requires additional nutrition to help support your growing baby. You also have a higher need for some vitamins and minerals, such as folic acid, calcium, iron, and vitamin D. Eating a healthy, well-balanced diet is very important for your health  and your baby's health. Your need for extra calories varies over the course of your pregnancy. Pregnancy is divided into three trimesters, with each trimester lasting 3 months. For most women, it is recommended to consume: 150 extra calories a day during the first trimester. 300 extra calories a day during the second trimester. 300 extra calories a day during the third trimester. What are tips for following this plan? Cooking Practice good food safety and cleanliness. Wash your hands before you eat and after you prepare raw meat. Wash all fruits and vegetables well before peeling or eating. Taking these actions can help to prevent  foodborne illnesses that can be very dangerous to your baby, such as listeriosis. Ask your health care provider for more information about listeriosis. Make sure that all meats, poultry, and eggs are cooked to food-safe temperatures or "well-done." Meal planning  Eat a variety of foods (especially fruits and vegetables) to get a full range of vitamins and minerals. Two or more servings of fish are recommended each week in order to get the most benefits from omega-3 fatty acids that are found in seafood. Choose fish that are lower in mercury, such as salmon and pollock. Limit your overall intake of foods that have "empty calories." These are foods that have little nutritional value, such as sweets, desserts, candies, and sugar-sweetened beverages. Drinks that contain caffeine are okay to drink, but it is better to avoid caffeine. Keep your total caffeine intake to less than 200 mg each day (which is 12 oz or 355 mL of coffee, tea, or soda) or the limit as told by your health care provider. General information Do not try to lose weight or go on a diet during pregnancy. Take a prenatal vitamin to help meet your additional vitamin and mineral needs during pregnancy, specifically for folic acid, iron, calcium, and vitamin D. Remember to stay active. Ask your health care provider what types of exercise and activities are safe for you. What does 150 extra calories look like? Healthy options that provide 150 extra calories each day could be any of the following: 6-8 oz (170-227 g) plain low-fat yogurt with  cup (70 g) berries. 1 apple with 2 tsp (11 g) peanut butter. Cut-up vegetables with  cup (60 g) hummus. 8 fl oz (237 mL) low-fat chocolate milk. 1 stick of string cheese with 1 medium orange. 1 peanut butter and jelly sandwich that is made with one slice of whole-wheat bread and 1 tsp (5 g) of peanut butter. For 300 extra calories, you could eat two of these healthy options each day. What is a  healthy amount of weight to gain? The right amount of weight gain for you is based on your BMI (body mass index) before you became pregnant. If your BMI was less than 18 (underweight), you should gain 28-40 lb (13-18 kg). If your BMI was 18-24.9 (normal), you should gain 25-35 lb (11-16 kg). If your BMI was 25-29.9 (overweight), you should gain 15-25 lb (7-11 kg). If your BMI was 30 or greater (obese), you should gain 11-20 lb (5-9 kg). What if I am having twins or multiples? Generally, if you are carrying twins or multiples: You may need to eat 300-600 extra calories a day. The recommended range for total weight gain is 25-54 lb (11-25 kg), depending on your BMI before pregnancy. Talk with your health care provider to find out about nutritional needs, weight gain, and exercise that is right for you. What foods should I eat?  Fruits All fruits. Eat a variety of colors and types of fruit. Remember to wash your fruits well before peeling or eating. Vegetables All vegetables. Eat a variety of colors and types of vegetables. Remember to wash your vegetables well before peeling or eating. Grains All grains. Choose whole grains, such as whole-wheat bread, oatmeal, or brown rice. Meats and other protein foods Lean meats, including chicken, [place], and lean cuts of beef, veal, or pork. Fish that is higher in omega-3 fatty acids and lower in mercury, such as salmon, herring, mussels, trout, sardines, pollock, shrimp, crab, and lobster. Tofu. Tempeh. Beans. Eggs. Peanut butter and other nut butters. Dairy Pasteurized milk and milk alternatives, such as almond milk. Pasteurized yogurt and pasteurized cheese. Cottage cheese. Sour cream. Beverages Water. Juices that contain 100% fruit juice or vegetable juice. Caffeine-free teas and decaffeinated coffee. Fats and oils Fats and oils are okay to include in moderation. Sweets and desserts Sweets and desserts are okay to include in moderation. Seasoning  and other foods All pasteurized condiments. The items listed above may not be a complete list of foods and beverages you can eat. Contact a dietitian for more information. What foods should I avoid? Fruits Raw (unpasteurized) fruit juices. Vegetables Unpasteurized vegetable juices. Meats and other protein foods Precooked or cured meat, such as bologna, hot dogs, sausages, or meat loaves. (If you must eat those meats, reheat them until they are steaming hot.) Refrigerated pate, meat spreads from a meat counter, or smoked seafood that is found in the refrigerated section of a store. Raw or undercooked meats, poultry, and eggs. Raw fish, such as sushi or sashimi. Fish that have high mercury content, such as tilefish, shark, swordfish, and king mackerel. Dairy Unpasteurized milk and any foods that have unpasteurized milk in them. Soft cheeses, such as feta, queso blanco, queso fresco, [place], [place], panela, and blue-veined cheeses (unless they are made with pasteurized milk, which must be stated on the label). Beverages Alcohol. Sugar-sweetened beverages, such as sodas, teas, or energy drinks. Seasoning and other foods Homemade fermented foods and drinks, such as pickles, sauerkraut, or kombucha drinks. (Store-bought pasteurized versions of these are okay.) Salads that are made in a store or deli, such as ham salad, chicken salad, egg salad, tuna salad, and seafood salad. The items listed above may not be a complete list of foods and beverages you should avoid. Contact a dietitian for more information. Where to find more information To calculate the number of calories you need based on your height, weight, and activity level, you can use an online calculator such as: PayStrike.dk To calculate how much weight you should gain during pregnancy, you can use an online pregnancy weight gain calculator such as: http://www.harvey.com/ To learn more about eating fish during pregnancy, talk with  your health care provider or visit: PumpkinSearch.com.ee Summary While you are pregnant, your body requires additional nutrition to help support your growing baby. Eat a variety of foods, especially fruits and vegetables, to get a full range of vitamins and minerals. Practice good food safety and cleanliness. Wash your hands before you eat and after you prepare raw meat. Wash all fruits and vegetables well before peeling or eating. Taking these actions can help to prevent foodborne illnesses, such as listeriosis, that can be very dangerous to your baby. Do not eat raw meat or fish. Do not eat fish that have high mercury content, such as tilefish, shark, swordfish, and king mackerel. Do not eat raw (unpasteurized) dairy. Take a prenatal vitamin  to help meet your additional vitamin and mineral needs during pregnancy, specifically for folic acid, iron, calcium, and vitamin D. This information is not intended to replace advice given to you by your health care provider. Make sure you discuss any questions you have with your health care provider. Document Revised: 01/10/2020 Document Reviewed: 01/12/2020 Elsevier Patient Education  2024 [employer].

## 2023-09-14 NOTE — Telephone Encounter (Signed)
 Left voicemail to return call. Will send my chart message.

## 2023-09-15 LAB — URINALYSIS, ROUTINE W REFLEX MICROSCOPIC
Bilirubin, UA: NEGATIVE
Glucose, UA: NEGATIVE
Ketones, UA: NEGATIVE
Leukocytes,UA: NEGATIVE
Nitrite, UA: NEGATIVE
Protein,UA: NEGATIVE
RBC, UA: NEGATIVE
Specific Gravity, UA: 1.016 (ref 1.005–1.030)
Urobilinogen, Ur: 0.2 mg/dL (ref 0.2–1.0)
pH, UA: 6.5 (ref 5.0–7.5)

## 2023-09-16 LAB — CBC/D/PLT+RPR+RH+ABO+RUBIGG...
Antibody Screen: NEGATIVE
Basophils Absolute: 0.1 10*3/uL (ref 0.0–0.2)
Basos: 1 %
EOS (ABSOLUTE): 0.1 10*3/uL (ref 0.0–0.4)
Eos: 2 %
HCV Ab: NONREACTIVE
HIV Screen 4th Generation wRfx: NONREACTIVE
Hematocrit: 35.9 % (ref 34.0–46.6)
Hemoglobin: 11.7 g/dL (ref 11.1–15.9)
Hepatitis B Surface Ag: NEGATIVE
Immature Grans (Abs): 0 10*3/uL (ref 0.0–0.1)
Immature Granulocytes: 0 %
Lymphocytes Absolute: 1.5 10*3/uL (ref 0.7–3.1)
Lymphs: 26 %
MCH: 27 pg (ref 26.6–33.0)
MCHC: 32.6 g/dL (ref 31.5–35.7)
MCV: 83 fL (ref 79–97)
Monocytes Absolute: 0.5 10*3/uL (ref 0.1–0.9)
Monocytes: 9 %
Neutrophils Absolute: 3.6 10*3/uL (ref 1.4–7.0)
Neutrophils: 62 %
Platelets: 250 10*3/uL (ref 150–450)
RBC: 4.33 x10E6/uL (ref 3.77–5.28)
RDW: 13.1 % (ref 11.7–15.4)
RPR Ser Ql: NONREACTIVE
Rh Factor: POSITIVE
Rubella Antibodies, IGG: 2.09 {index} (ref >0.99)
Varicella zoster IgG: REACTIVE
WBC: 5.9 10*3/uL (ref 3.4–10.8)

## 2023-09-16 LAB — HGB FRACTIONATION CASCADE
Hgb A2: 3 % (ref 1.8–3.2)
Hgb A: 61.5 % — ABNORMAL LOW (ref 96.4–98.8)
Hgb F: 1.2 % (ref 0.0–2.0)
Hgb S: 34.3 % — ABNORMAL HIGH

## 2023-09-16 LAB — CERVICOVAGINAL ANCILLARY ONLY
Bacterial Vaginitis (gardnerella): NEGATIVE
Candida Glabrata: NEGATIVE
Candida Vaginitis: NEGATIVE
Chlamydia: NEGATIVE
Comment: NEGATIVE
Comment: NEGATIVE
Comment: NEGATIVE
Comment: NEGATIVE
Comment: NEGATIVE
Comment: NORMAL
Neisseria Gonorrhea: NEGATIVE
Trichomonas: NEGATIVE

## 2023-09-16 LAB — HCV INTERPRETATION

## 2023-09-16 LAB — MONITOR DRUG PROFILE 14(MW)
Amphetamine Scrn, Ur: NEGATIVE ng/mL
BARBITURATE SCREEN URINE: NEGATIVE ng/mL
BENZODIAZEPINE SCREEN, URINE: NEGATIVE ng/mL
Buprenorphine, Urine: NEGATIVE ng/mL
CANNABINOIDS UR QL SCN: NEGATIVE ng/mL
Cocaine (Metab) Scrn, Ur: NEGATIVE ng/mL
Creatinine(Crt), U: 76.6 mg/dL (ref 20.0–300.0)
Fentanyl, Urine: NEGATIVE pg/mL
Meperidine Screen, Urine: NEGATIVE ng/mL
Methadone Screen, Urine: NEGATIVE ng/mL
OXYCODONE+OXYMORPHONE UR QL SCN: NEGATIVE ng/mL
Opiate Scrn, Ur: NEGATIVE ng/mL
Ph of Urine: 6.3 (ref 4.5–8.9)
Phencyclidine Qn, Ur: NEGATIVE ng/mL
Propoxyphene Scrn, Ur: NEGATIVE ng/mL
SPECIFIC GRAVITY: 1.016
Tramadol Screen, Urine: NEGATIVE ng/mL

## 2023-09-16 LAB — NICOTINE SCREEN, URINE: Cotinine Ql Scrn, Ur: NEGATIVE ng/mL

## 2023-09-16 LAB — HGB SOLUBILITY: Hgb Solubility: POSITIVE — AB

## 2023-09-16 LAB — CULTURE, OB URINE

## 2023-09-16 LAB — URINE CULTURE, OB REFLEX: Organism ID, Bacteria: NO GROWTH

## 2023-09-18 ENCOUNTER — Encounter: Payer: Self-pay | Admitting: Advanced Practice Midwife

## 2023-09-18 ENCOUNTER — Telehealth: Payer: Self-pay

## 2023-09-18 NOTE — Telephone Encounter (Signed)
 Pt calling to discuss her abnormal results from 09/14/23 . Hgb Solubility was positive. Please advise

## 2023-09-19 LAB — MATERNIT 21 PLUS CORE, BLOOD
Fetal Fraction: 12
Result (T21): NEGATIVE
Trisomy 13 (Patau syndrome): NEGATIVE
Trisomy 18 (Edwards syndrome): NEGATIVE
Trisomy 21 (Down syndrome): NEGATIVE

## 2023-09-21 DIAGNOSIS — O283 Abnormal ultrasonic finding on antenatal screening of mother: Secondary | ICD-10-CM | POA: Insufficient documentation

## 2023-09-22 ENCOUNTER — Ambulatory Visit: Attending: Advanced Practice Midwife

## 2023-09-22 ENCOUNTER — Ambulatory Visit

## 2023-09-22 ENCOUNTER — Other Ambulatory Visit: Payer: Self-pay

## 2023-09-22 ENCOUNTER — Ambulatory Visit (HOSPITAL_BASED_OUTPATIENT_CLINIC_OR_DEPARTMENT_OTHER): Admitting: Obstetrics

## 2023-09-22 VITALS — BP 121/80 | HR 83

## 2023-09-22 DIAGNOSIS — O283 Abnormal ultrasonic finding on antenatal screening of mother: Secondary | ICD-10-CM

## 2023-09-22 DIAGNOSIS — Z363 Encounter for antenatal screening for malformations: Secondary | ICD-10-CM | POA: Diagnosis not present

## 2023-09-22 DIAGNOSIS — Z3480 Encounter for supervision of other normal pregnancy, unspecified trimester: Secondary | ICD-10-CM | POA: Insufficient documentation

## 2023-09-22 DIAGNOSIS — O468X1 Other antepartum hemorrhage, first trimester: Secondary | ICD-10-CM | POA: Diagnosis not present

## 2023-09-22 DIAGNOSIS — Z3A12 12 weeks gestation of pregnancy: Secondary | ICD-10-CM | POA: Insufficient documentation

## 2023-09-22 DIAGNOSIS — O358XX0 Maternal care for other (suspected) fetal abnormality and damage, not applicable or unspecified: Secondary | ICD-10-CM | POA: Insufficient documentation

## 2023-09-22 DIAGNOSIS — O418X10 Other specified disorders of amniotic fluid and membranes, first trimester, not applicable or unspecified: Secondary | ICD-10-CM | POA: Insufficient documentation

## 2023-09-22 NOTE — Progress Notes (Signed)
 MFM Consult Note  Brandi [name] is currently at 12 weeks and 5 days.  She was seen today as there was a complex area that was noted within the gestational sac during an ultrasound that was performed in your office.  The patient denies any vaginal bleeding.  She had a cell free DNA test which indicated a low risk for trisomy 21, 18, and 13.  The patient did not want the fetal gender revealed.  On today's exam, a viable singleton intrauterine gestation with a crown-rump length consistent with an [facility] of April 01, 2024 was noted.  A 3 cm round well-circumscribed mass emanating from the fetal surface of the placenta was noted within the gestational sac.  The patient was advised that this mass is most likely a subchorionic hematoma.  The mass is attached to the placenta.  The fetus is moving freely within the gestational sac.  The patient was advised that the subchorionic hematoma will most likely be resorbed by her body as her pregnancy progresses.  The fetus will also grow larger and the mass may either stay the same size or may decrease in size later in her pregnancy.  Therefore, the mass may become less prominent later in her pregnancy.  As long as the fetus continues to grow larger, she was reassured that her fetus will most likely not be affected by this mass.The mass is not attached to the fetus.  The increased risk of miscarriage, IUGR, and PPROM associated with a large subchorionic hematoma was discussed.  We will continue to follow her closely to assess this finding.    She will return to our office in 4 weeks for a growth scan and early assessment of the fetal anatomy.  The patient stated that all of her questions were answered today.  A total of 30 minutes was spent counseling and coordinating the care for this patient.  Greater than 50% of the time was spent in direct face-to-face contact.

## 2023-09-28 ENCOUNTER — Other Ambulatory Visit

## 2023-09-28 ENCOUNTER — Other Ambulatory Visit: Payer: Self-pay | Admitting: *Deleted

## 2023-09-28 DIAGNOSIS — O418X10 Other specified disorders of amniotic fluid and membranes, first trimester, not applicable or unspecified: Secondary | ICD-10-CM

## 2023-10-09 ENCOUNTER — Encounter: Payer: Self-pay | Admitting: Emergency Medicine

## 2023-10-09 ENCOUNTER — Other Ambulatory Visit: Payer: Self-pay

## 2023-10-09 ENCOUNTER — Ambulatory Visit
Admission: EM | Admit: 2023-10-09 | Discharge: 2023-10-09 | Disposition: A | Discharge status: Home / Self Care | Attending: Emergency Medicine | Admitting: Emergency Medicine

## 2023-10-09 DIAGNOSIS — R102 Pelvic and perineal pain: Secondary | ICD-10-CM | POA: Diagnosis not present

## 2023-10-09 LAB — POCT URINALYSIS DIP (MANUAL ENTRY)
Bilirubin, UA: NEGATIVE
Blood, UA: NEGATIVE
Glucose, UA: NEGATIVE mg/dL
Ketones, POC UA: NEGATIVE mg/dL
Nitrite, UA: NEGATIVE
Protein Ur, POC: NEGATIVE mg/dL
Spec Grav, UA: 1.02
Urobilinogen, UA: 0.2 U/dL
pH, UA: 6

## 2023-10-09 NOTE — Discharge Instructions (Addendum)
 Today you are evaluated for your discomfort with to your lower abdomen  Urinalysis at this time is negative for infection and has been sent to the lab for 3 days to determine if bacteria will grow, if this does occur you will be notified and antibiotic sent to pharmacy  Labs pending 2-3 days, you will be contacted if positive for any sti and treatment will be sent to the pharmacy, you will have to return to the clinic if positive for gonorrhea to receive treatment   Please refrain from having sex until labs results, if positive please refrain from having sex until treatment complete and symptoms resolve   If positive for Chlamydia  gonorrhea or trichomoniasis please notify partner or partners so they may tested as well  Moving forward, it is recommended you use some form of protection against the transmission of sti infections  such as condoms or dental dams with each sexual encounter

## 2023-10-09 NOTE — ED Triage Notes (Signed)
 Patient presents to [facility] for evaluation of 2 days of discomfort while urinating.  She is currently 4 months pregnant and doing prenatal care.  C/o mild vaginal discharge

## 2023-10-09 NOTE — ED Provider Notes (Signed)
 Renaldo Fiddler    CSN: [id number] Arrival date & time: 10/09/23  8469      History   Chief Complaint Chief Complaint  Patient presents with   Dysuria    HPI Brandi [name] is a 23 y.o. female.   Patient presents for evaluation of dull aching intermittent lower abdominal pressure present for 2 to 3 days.  Endorses urinary frequency but denies worsening from baseline.  Has not attempted treatment.  Denies vaginal discharge, itching or odor, urgency, dysuria, flank pain or fevers.  Currently [redacted] weeks pregnant.  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 Authorizing Provider  Prenatal Vit-Fe Fumarate-FA (PRENATAL PO) Take by mouth.    [provider]    Family History History reviewed. No pertinent family history.  Social History Social History   Tobacco Use   Smoking status: Never   Smokeless tobacco: Never  Vaping Use   Vaping status: Former  Substance Use Topics   Alcohol use: Not Currently   Drug use: Not Currently    Types: Marijuana    Comment: last used November 2024     Allergies   Plum pulp, Apple juice, and Pork allergy   Review of Systems Review of Systems  Genitourinary:  Positive for dysuria.     Physical Exam Triage Vital Signs ED Triage Vitals  Encounter Vitals Group     BP 10/09/23 0943 113/77     Systolic BP  Percentile --      Diastolic BP Percentile --      Pulse Rate 10/09/23 0943 86     Resp 10/09/23 0943 16     Temp 10/09/23 0943 98 F (36.7 C)     Temp Source 10/09/23 0943 Oral     SpO2 10/09/23 0943 99 %     Weight --      Height --      Head Circumference --      Peak Flow --      Pain Score 10/09/23 0944 0     Pain Loc --      Pain Education --      Exclude from Growth Chart --    No data found.  Updated Vital Signs BP 113/77 (BP Location: Left Arm)   Pulse 86   Temp 98 F (36.7 C) (Oral)   Resp 16   LMP 06/26/2023 (Exact Date)   SpO2 99%   Visual Acuity Right Eye Distance:   Left Eye Distance:   Bilateral Distance:    Right Eye Near:  Left Eye Near:    Bilateral Near:     Physical Exam Constitutional:      Appearance: Normal appearance.  Eyes:     Extraocular Movements: Extraocular movements intact.  Pulmonary:     Effort: Pulmonary effort is normal.  Abdominal:     Tenderness: There is abdominal tenderness in the suprapubic area.  Neurological:     Mental Status: She is alert and oriented to person, place, and time. Mental status is at baseline.      UC Treatments / Results  Labs (all labs ordered are listed, but only abnormal results are displayed) Labs Reviewed  POCT URINALYSIS DIP (MANUAL ENTRY) - Abnormal; Notable for the following components:      Result Value   Leukocytes, UA Trace (*)    All other components within normal limits  URINE CULTURE  CERVICOVAGINAL ANCILLARY ONLY    EKG   Radiology No results found.  Procedures Procedures (including critical care time)  Medications Ordered in UC Medications - No data to display  Initial Impression / Assessment and Plan / UC Course  I have reviewed the triage vital signs and the nursing notes.  Pertinent labs & imaging results that were available during my care of the patient were reviewed by me and considered in my medical decision making (see chart for details).  Suprapubic  pressure  Urinalysis negative, sent for culture, will initiate antibiotics based on results,STI labs pending will treat per protocol, advised abstinence until lab results, and/or treatment is complete, advised condom use during all sexual encounters moving, may follow-up with urgent care as needed  Final Clinical Impressions(s) / UC Diagnoses   Final diagnoses:  Suprapubic pressure     Discharge Instructions      Today you are evaluated for your discomfort with to your lower abdomen  Urinalysis at this time is negative for infection and has been sent to the lab for 3 days to determine if bacteria will grow, if this does occur you will be notified and antibiotic sent to pharmacy  Labs pending 2-3 days, you will be contacted if positive for any sti and treatment will be sent to the pharmacy, you will have to return to the clinic if positive for gonorrhea to receive treatment   Please refrain from having sex until labs results, if positive please refrain from having sex until treatment complete and symptoms resolve   If positive for Chlamydia  gonorrhea or trichomoniasis please notify partner or partners so they may tested as well  Moving forward, it is recommended you use some form of protection against the transmission of sti infections  such as condoms or dental dams with each sexual encounter     ED Prescriptions   None    PDMP not reviewed this encounter.   Valinda Hoar, NP 10/09/23 1027

## 2023-10-10 LAB — URINE CULTURE: Culture: NO GROWTH

## 2023-10-12 ENCOUNTER — Ambulatory Visit (INDEPENDENT_AMBULATORY_CARE_PROVIDER_SITE_OTHER): Admitting: Obstetrics

## 2023-10-12 VITALS — BP 101/68 | HR 106 | Wt 154.0 lb

## 2023-10-12 DIAGNOSIS — Z3402 Encounter for supervision of normal first pregnancy, second trimester: Secondary | ICD-10-CM | POA: Diagnosis not present

## 2023-10-12 DIAGNOSIS — D573 Sickle-cell trait: Secondary | ICD-10-CM | POA: Insufficient documentation

## 2023-10-12 NOTE — Progress Notes (Signed)
    Return Prenatal Note   Subjective  23 y.o. G1P0 at [redacted]w[redacted]d presents for this follow-up prenatal visit. Pregnancy notable for [facility].   Patient no concerns today, went to urgent care last week for dysuria, urine culture negative. Still waiting on a vaginal swab result. Has follow up US  next week with MFM for [facility] seen on her dating scan, denies vaginal bleeding. Is also scheduled with MFM for a detail anatomy US  on 5/13. Pt looked at her NIPT results and knows gender, is going to surprise her boyfriend. Concerned that she is not feeling FM yet, her friend is pregnant, 3 wks ahead of her, and is feeling FM already.   Patient reports: Movement: Absent Contractions: Not present Denies vaginal bleeding or leaking fluid. Objective  Flow sheet Vitals: Pulse Rate: (!) 106 BP: 101/68 Total weight gain: 9 lb (4.082 kg)  General Appearance  No acute distress, well appearing, and well nourished Pulmonary   Normal work of breathing Neurologic   Alert and oriented to person, place, and time Psychiatric   Mood and affect within normal limits  Assessment/Plan   Plan  23 y.o. G1P0 at [redacted]w[redacted]d LMP=9wk US  presents for follow-up OB visit. Reviewed prenatal record including previous visit note. 1. Encounter for supervision of normal first pregnancy in second trimester (Primary) -Reviewed NOB lab results, aware of sickle cell trait -Repeat US  for [facility] on 4/23, then anatomy on 5/13, both scheduled with MFM -Reviewed 2nd trimester anticipatory guidance, reassurance re: FM, should start feeling btwn 20-24wks; ok to not be feeling at this EGA.   Return in about 4 weeks (around 11/09/2023) for ROB.   Future Appointments  Date Time Provider Department Center  10/21/2023  1:00 PM [facility] PROVIDER 1 [facility]  10/21/2023  1:30 PM [facility] US1 [facility]  11/09/2023 10:15 AM Forestine Igo, CNM [facility] None  11/10/2023  2:00 PM [facility] PROVIDER 1 [facility]  11/10/2023  2:30 PM [facility] US2 [facility]  [facility]   For next visit:  continue with routine prenatal care    Sofia Dunn, DO [facility]

## 2023-10-13 LAB — CERVICOVAGINAL ANCILLARY ONLY
Bacterial Vaginitis (gardnerella): NEGATIVE
Candida Glabrata: NEGATIVE
Candida Vaginitis: NEGATIVE
Chlamydia: NEGATIVE
Comment: NEGATIVE
Comment: NEGATIVE
Comment: NEGATIVE
Comment: NEGATIVE
Comment: NEGATIVE
Comment: NORMAL
Neisseria Gonorrhea: NEGATIVE
Trichomonas: NEGATIVE

## 2023-10-14 ENCOUNTER — Ambulatory Visit: Admitting: Nurse Practitioner

## 2023-10-21 ENCOUNTER — Other Ambulatory Visit: Payer: Self-pay | Admitting: *Deleted

## 2023-10-21 ENCOUNTER — Ambulatory Visit: Attending: Obstetrics | Admitting: Maternal & Fetal Medicine

## 2023-10-21 ENCOUNTER — Ambulatory Visit

## 2023-10-21 VITALS — BP 116/80 | HR 87

## 2023-10-21 DIAGNOSIS — O30032 Twin pregnancy, monochorionic/diamniotic, second trimester: Secondary | ICD-10-CM

## 2023-10-21 DIAGNOSIS — Z3402 Encounter for supervision of normal first pregnancy, second trimester: Secondary | ICD-10-CM | POA: Diagnosis not present

## 2023-10-21 DIAGNOSIS — O468X2 Other antepartum hemorrhage, second trimester: Secondary | ICD-10-CM | POA: Diagnosis not present

## 2023-10-21 DIAGNOSIS — Z363 Encounter for antenatal screening for malformations: Secondary | ICD-10-CM | POA: Diagnosis not present

## 2023-10-21 DIAGNOSIS — Z3A16 16 weeks gestation of pregnancy: Secondary | ICD-10-CM | POA: Diagnosis not present

## 2023-10-21 DIAGNOSIS — D573 Sickle-cell trait: Secondary | ICD-10-CM

## 2023-10-21 DIAGNOSIS — O418X10 Other specified disorders of amniotic fluid and membranes, first trimester, not applicable or unspecified: Secondary | ICD-10-CM

## 2023-10-21 DIAGNOSIS — Z3480 Encounter for supervision of other normal pregnancy, unspecified trimester: Secondary | ICD-10-CM | POA: Diagnosis not present

## 2023-10-21 DIAGNOSIS — O358XX0 Maternal care for other (suspected) fetal abnormality and damage, not applicable or unspecified: Secondary | ICD-10-CM

## 2023-10-21 DIAGNOSIS — O99013 Anemia complicating pregnancy, third trimester: Secondary | ICD-10-CM | POA: Diagnosis not present

## 2023-10-21 DIAGNOSIS — O364XX0 Maternal care for intrauterine death, not applicable or unspecified: Secondary | ICD-10-CM

## 2023-10-21 NOTE — Progress Notes (Signed)
 MFM consultation  Ms. Slaugh is a 23 yo G1P0 who is seen at 16w 5d with due to a suspected subchorionic hematoma.  She is doing well today without complaints. She had a low risk NIPT via Maternity 21.  Today we observed the mass that was seen previously. However, the mass appears larger and we see a divinding membrane suggesting a monochorionic diamniotic twin pregnancy. There is not a twin peak or lamdba sign.  The differential diagnosis includes demise of twin in the setting of  twin reversal arterial perfusion syndrome, vanishing twin vs placental mass in a twin pregnancy. There is no evidence of hypervascularity in the mass. The UAD are normal for remaining twin.   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   I discussed today's findings and reviewed the differential diagnosis.   I submitted a referral for a fetal echocardiogram to be performed in 4-6 weeks. We will peform a detailed exam in 4 weeks.   Lastly, I ordered a [employer]atera NIPT to assess zygosity and detection of twin pregnancy.  I spent 30 minutes with > 50% in face to face consultation.  All questions answered.  Tonya Fredrickson, MD

## 2023-10-29 LAB — PANORAMA PRENATAL TEST FULL PANEL:PANORAMA TEST PLUS 5 ADDITIONAL MICRODELETIONS: FETAL FRACTION: 5.6

## 2023-10-30 ENCOUNTER — Telehealth: Payer: Self-pay

## 2023-10-30 NOTE — Telephone Encounter (Signed)
 I spoke with the patient to return the [facility]orama NIPS results. She had MaterniT21 genome that was low-risk, but it was ordered for a singleton pregnancy and [facility] does not provide zygosity assessment.  Dr. Arcola Kocher ordered this test to determine zygosity, which returned as monozygotic female twin pregnancy. The result was low-risk for trisomy 35, trisomy 53, trisomy 70, and sex chromosome aneuploidies. This greatly reduces but does not eliminate the chance of these conditions in the pregnancy. NIPS does not screen for all genetic conditions.  Georgean Kindle, MS, [facility] Certified Genetic Counselor [facility] [phone]

## 2023-11-09 ENCOUNTER — Encounter: Admitting: Certified Nurse Midwife

## 2023-11-09 DIAGNOSIS — Z3A19 19 weeks gestation of pregnancy: Secondary | ICD-10-CM

## 2023-11-09 DIAGNOSIS — Z3480 Encounter for supervision of other normal pregnancy, unspecified trimester: Secondary | ICD-10-CM

## 2023-11-10 ENCOUNTER — Ambulatory Visit (INDEPENDENT_AMBULATORY_CARE_PROVIDER_SITE_OTHER): Admitting: Obstetrics

## 2023-11-10 ENCOUNTER — Ambulatory Visit

## 2023-11-10 ENCOUNTER — Other Ambulatory Visit

## 2023-11-10 VITALS — BP 102/67 | HR 105 | Wt 162.0 lb

## 2023-11-10 DIAGNOSIS — Z3480 Encounter for supervision of other normal pregnancy, unspecified trimester: Secondary | ICD-10-CM

## 2023-11-10 DIAGNOSIS — Z3A19 19 weeks gestation of pregnancy: Secondary | ICD-10-CM | POA: Diagnosis not present

## 2023-11-10 DIAGNOSIS — R399 Unspecified symptoms and signs involving the genitourinary system: Secondary | ICD-10-CM | POA: Diagnosis not present

## 2023-11-10 LAB — POCT URINALYSIS DIPSTICK OB
Bilirubin, UA: NEGATIVE
Blood, UA: NEGATIVE
Glucose, UA: NEGATIVE
Ketones, UA: NEGATIVE
Leukocytes, UA: NEGATIVE
Nitrite, UA: NEGATIVE
POC,PROTEIN,UA: NEGATIVE
Spec Grav, UA: 1.01 (ref 1.010–1.025)
Urobilinogen, UA: 0.2 U/dL
pH, UA: 6.5 (ref 5.0–8.0)

## 2023-11-10 NOTE — Progress Notes (Signed)
    Return Prenatal Note   Assessment/Plan   Plan  23 y.o. G1P0 at [redacted]w[redacted]d presents for follow-up OB visit. Reviewed prenatal record including previous visit note.  Supervision of other normal pregnancy, antepartum -F/u visits scheduled with MFM -Anticipatory guidance about second trimester and fetal development -UA unremarkable. Discussed normal discomforts of pregnancy and growing uterus -Discussed danger signs and when to seek medical attention   Orders Placed This Encounter  Procedures   POC Urinalysis Dipstick OB   Return in about 4 weeks (around 12/08/2023).   Future Appointments  Date Time Provider Department Center  11/18/2023  2:00 PM [facility] PROVIDER 1 [facility]  11/18/2023  2:30 PM [facility] US2 [facility]  12/08/2023  1:55 PM Forestine Igo, CNM [facility] None  12/16/2023  1:00 PM [facility] PROVIDER 1 [facility]  12/16/2023  1:30 PM [facility] US4 [facility]    For next visit:  Routine prenatal care    Subjective   Brandi [name] has been having some difficulty with urination. She feels like sometimes it is normal and sometimes her urine stream is interrupted and she cannot fully empty her bladder, or she has to urinate again shortly after using the bathroom. She denies pelvic pressure and suprapubic pain. She feels she is voiding an adequate amount. She denies dysuria and hematuria.  Movement: Present  Objective   Flow sheet Vitals: Pulse Rate: (!) 105 BP: 102/67 Fundal Height: 19 cm Fetal Heart Rate (bpm): 140 Total weight gain: 17 lb (7.711 kg)  General Appearance  No acute distress, well appearing, and well nourished Pulmonary   Normal work of breathing Neurologic   Alert and oriented to person, place, and time Psychiatric   Mood and affect within normal limits  Josue Nip, CNM 11/10/23

## 2023-11-10 NOTE — Assessment & Plan Note (Addendum)
-  F/u visits scheduled with MFM -Anticipatory guidance about second trimester and fetal development -UA unremarkable. Discussed normal discomforts of pregnancy and growing uterus -Discussed danger signs and when to seek medical attention

## 2023-11-17 ENCOUNTER — Encounter: Payer: Self-pay | Admitting: Advanced Practice Midwife

## 2023-11-18 ENCOUNTER — Ambulatory Visit: Attending: Obstetrics | Admitting: Obstetrics and Gynecology

## 2023-11-18 ENCOUNTER — Other Ambulatory Visit: Payer: Self-pay | Admitting: *Deleted

## 2023-11-18 ENCOUNTER — Telehealth: Payer: Self-pay

## 2023-11-18 ENCOUNTER — Ambulatory Visit (HOSPITAL_BASED_OUTPATIENT_CLINIC_OR_DEPARTMENT_OTHER)

## 2023-11-18 DIAGNOSIS — Z3A20 20 weeks gestation of pregnancy: Secondary | ICD-10-CM | POA: Insufficient documentation

## 2023-11-18 DIAGNOSIS — O30032 Twin pregnancy, monochorionic/diamniotic, second trimester: Secondary | ICD-10-CM | POA: Diagnosis not present

## 2023-11-18 DIAGNOSIS — O283 Abnormal ultrasonic finding on antenatal screening of mother: Secondary | ICD-10-CM | POA: Diagnosis not present

## 2023-11-18 DIAGNOSIS — O43022 Fetus-to-fetus placental transfusion syndrome, second trimester: Secondary | ICD-10-CM

## 2023-11-18 DIAGNOSIS — Z148 Genetic carrier of other disease: Secondary | ICD-10-CM | POA: Diagnosis not present

## 2023-11-18 DIAGNOSIS — D573 Sickle-cell trait: Secondary | ICD-10-CM | POA: Diagnosis not present

## 2023-11-18 DIAGNOSIS — O358XX0 Maternal care for other (suspected) fetal abnormality and damage, not applicable or unspecified: Secondary | ICD-10-CM | POA: Insufficient documentation

## 2023-11-18 DIAGNOSIS — Z363 Encounter for antenatal screening for malformations: Secondary | ICD-10-CM | POA: Diagnosis not present

## 2023-11-18 DIAGNOSIS — O358XX1 Maternal care for other (suspected) fetal abnormality and damage, fetus 1: Secondary | ICD-10-CM

## 2023-11-18 DIAGNOSIS — O418X10 Other specified disorders of amniotic fluid and membranes, first trimester, not applicable or unspecified: Secondary | ICD-10-CM

## 2023-11-18 NOTE — Progress Notes (Signed)
 Maternal-Fetal Medicine Consultation Name: Brandi, [name] MRN: [id number]  G1 P0 at 20-weeks' gestation.  Patient returned for fetal anatomical survey.  At first trimester and early second trimester scan, possibility of subchorionic hematoma was raised.  At previous ultrasound performed 4 weeks ago, monochorionic twin gestation with fetal demise/vanishing twin/TRAP sequence was suspected. Subsequently, on cell-free fetal DNA screening, monozygotic twins were diagnosed with no increased risk for chromosomal anomalies.  Patient reports no chronic medical conditions.  Ultrasound We performed a fetal anatomical survey.  Amniotic fluid is normal and good fetal activity seen.  Fetal biometry is consistent with the previously established dates.  No markers of aneuploidies or obvious fetal structural defects are seen. Cardiac anatomy appears normal.  No evidence of pericardial-or pleural effusion.  No evidence of hydrops. Umbilical artery Doppler showed normal forward diastolic flow.  A thin dividing membrane suggesting monochorionic placentation was seen.  An amorphous structure, measuring 5.2 x 2.9 x 2.8 cm, is seen.  On color Doppler flow, arterioarterial communication was seen along the placental surface from the normal twin to the amorphous structure.  Using the formula to estimated the weight of acardiac twin, we calculated that the ratio of acardiac/pump twin is 5%. (acardiac twin weight= 1.2 (longest length in cm)2-(1.7 x longest length in cm).   Twin Reversed Arterial Perfusion Sequence (TRAP) I explained the finding with help of ultrasound images and diagrams.  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  TRAP is an artery-to-artery anastomosis that directs deoxygenated blood from the "pump twin" (normal twin) to the acardiac twin. Blood returns to the pump twin through vein-to-vein anastomosis.  Potentially, this can lead to high output cardiac failure in the pump twin and fetal demise.   Spontaneous resolution (obliteration of blood flow between the arm twin and the acardiac twin) is possible.  Intrauterine intervention with ablation of the communicating vessel improves perinatal outcome in the pump 20.  However fetal scopic intervention can lead to rupture of membranes, preterm delivery and fetal death.    In most centers, intervention is attempted only if the weight ratio is greater than 50% to 70% or if there are any signs of cardiac failure in the pump twin.  I recommended weekly surveillance for now.  Differential diagnosis: Chorioangioma. Less likely, because this mass is not intraplacental and not well circumscribed.   Later, I discussed with Dr. Angelina Kempf, MFM, [facility]. [facility] whether they do prophylactic intervention. Consistent with guidelines followed in [place], prophylactic intervention is not performed as the risk of rupture of membranes/preterm delivery is higher.  Recommendations - Weekly monitoring to assess the size of the acardiac twin and for obliteration of blood flow between the pump twin and the acardiac twin. -MCA Doppler to be performed at every ultrasound visit. - Patient has an appointment for fetal echocardiography at [facility] on 12/09/2023. - Will make a referral to MFM at [facility] for intervention as needed.  Consultation including face-to-face (more than 50%) counseling 40 minutes.

## 2023-11-25 ENCOUNTER — Other Ambulatory Visit: Payer: Self-pay | Admitting: Obstetrics and Gynecology

## 2023-11-25 ENCOUNTER — Ambulatory Visit: Admitting: Obstetrics and Gynecology

## 2023-11-25 ENCOUNTER — Ambulatory Visit: Attending: Obstetrics and Gynecology

## 2023-11-25 VITALS — BP 111/61 | HR 97

## 2023-11-25 DIAGNOSIS — O30032 Twin pregnancy, monochorionic/diamniotic, second trimester: Secondary | ICD-10-CM

## 2023-11-25 DIAGNOSIS — Z3A21 21 weeks gestation of pregnancy: Secondary | ICD-10-CM

## 2023-11-25 DIAGNOSIS — O283 Abnormal ultrasonic finding on antenatal screening of mother: Secondary | ICD-10-CM | POA: Diagnosis not present

## 2023-11-25 DIAGNOSIS — O358XX1 Maternal care for other (suspected) fetal abnormality and damage, fetus 1: Secondary | ICD-10-CM | POA: Insufficient documentation

## 2023-11-25 DIAGNOSIS — D573 Sickle-cell trait: Secondary | ICD-10-CM

## 2023-11-25 DIAGNOSIS — O358XX0 Maternal care for other (suspected) fetal abnormality and damage, not applicable or unspecified: Secondary | ICD-10-CM | POA: Diagnosis not present

## 2023-11-25 DIAGNOSIS — O318X20 Other complications specific to multiple gestation, second trimester, not applicable or unspecified: Secondary | ICD-10-CM | POA: Diagnosis not present

## 2023-11-25 NOTE — Progress Notes (Signed)
 Maternal-Fetal Medicine Consultation Name: Brandi [name] MRN: [id number]  G1 P0 at 21w 5d gestation. 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  Ultrasound A limited ultrasound study was performed.  Amniotic fluid is normal and good fetal activity seen.  No evidence of pleural-or pericardial effusions.  No evidence of fetal hydrops.  Middle cerebral artery Doppler showed normal peak systolic velocity measurements (no evidence of fetal anemia). I think dividing membrane suggesting monochorionic placentation is seen. The amorphous structure consistent with acardiac twin is seen again and it measures 7.2 x 3.3 x 5.5 cm, and this appears to be slightly increased from her previous ultrasound. Part of the mass appears to blood clot and is loosely attached to the mass. The calculated weight of acardiac twin using the formula mentioned in our previous report is 44 grams and the weight ratio with that of the pump twin is 10% (far less than the 50% that would prompt intervention). Communicating vessels from the pump twin to the acardiac twin are seen along the placenta.  I counseled the patient on the encouraging findings and that intervention is not indicated now. I informed her of my discussion with MFM at [facility]. I discussed the importance of weekly surveillance to monitor the size of acardiac twin.  Recommendations -Weekly MCA and fetal weight assessments to calculate the weight ratio.   Consultation including face-to-face (more than 50%) counseling 20 minutes.

## 2023-12-02 ENCOUNTER — Other Ambulatory Visit: Payer: Self-pay | Admitting: Obstetrics and Gynecology

## 2023-12-02 ENCOUNTER — Ambulatory Visit: Payer: Self-pay | Admitting: Obstetrics and Gynecology

## 2023-12-02 ENCOUNTER — Ambulatory Visit: Payer: Self-pay | Attending: Obstetrics and Gynecology

## 2023-12-02 VITALS — BP 109/64 | HR 83

## 2023-12-02 DIAGNOSIS — O99012 Anemia complicating pregnancy, second trimester: Secondary | ICD-10-CM | POA: Insufficient documentation

## 2023-12-02 DIAGNOSIS — Z3480 Encounter for supervision of other normal pregnancy, unspecified trimester: Secondary | ICD-10-CM | POA: Insufficient documentation

## 2023-12-02 DIAGNOSIS — Z3A22 22 weeks gestation of pregnancy: Secondary | ICD-10-CM

## 2023-12-02 DIAGNOSIS — O30032 Twin pregnancy, monochorionic/diamniotic, second trimester: Secondary | ICD-10-CM | POA: Insufficient documentation

## 2023-12-02 DIAGNOSIS — Z362 Encounter for other antenatal screening follow-up: Secondary | ICD-10-CM | POA: Insufficient documentation

## 2023-12-02 DIAGNOSIS — Z148 Genetic carrier of other disease: Secondary | ICD-10-CM | POA: Insufficient documentation

## 2023-12-02 DIAGNOSIS — O358XX0 Maternal care for other (suspected) fetal abnormality and damage, not applicable or unspecified: Secondary | ICD-10-CM

## 2023-12-02 DIAGNOSIS — D573 Sickle-cell trait: Secondary | ICD-10-CM | POA: Insufficient documentation

## 2023-12-02 DIAGNOSIS — O358XX1 Maternal care for other (suspected) fetal abnormality and damage, fetus 1: Secondary | ICD-10-CM | POA: Insufficient documentation

## 2023-12-02 DIAGNOSIS — Z36 Encounter for antenatal screening for chromosomal anomalies: Secondary | ICD-10-CM | POA: Insufficient documentation

## 2023-12-02 DIAGNOSIS — O402XX0 Polyhydramnios, second trimester, not applicable or unspecified: Secondary | ICD-10-CM | POA: Insufficient documentation

## 2023-12-02 DIAGNOSIS — O283 Abnormal ultrasonic finding on antenatal screening of mother: Secondary | ICD-10-CM

## 2023-12-02 NOTE — Progress Notes (Signed)
 Maternal-Fetal Medicine Consultation Name: Brandi [name] MRN: [id number]  G1 P0 at 22w 5d gestation.  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  Ultrasound A limited ultrasound study was performed.  Mild polyhydramnios is seen. Good fetal activity seen.  No evidence of pleural-or pericardial effusions.  No evidence of fetal hydrops.  Middle cerebral artery Doppler showed normal peak systolic velocity measurements (no evidence of fetal anemia).  I think dividing membrane suggesting monochorionic placentation is seen.  The amorphous structure consistent with acardiac twin is seen again and it measures 7.1 x 6.6 x 4.4 cm, and this appears to be essentially-unchanged from previous scan.  The calculated weight of acardiac twin using the formula mentioned in our previous report is 48 grams and the weight ratio with that of the pump twin is 8% (far less than the 50% that would prompt intervention). Communicating vessels from the pump twin to the acardiac twin are seen along the placenta.  I counseled the patient on the encouraging findings and that intervention is not indicated now. We will decrease the frequency of surveillance to every 2 weeks.  Recommendations Patient will return in 2 weeks for ultrasound assessment of her pregnancy.  Consultation including face-to-face (more than 50%) counseling 10 minutes.

## 2023-12-08 ENCOUNTER — Ambulatory Visit: Admitting: Certified Nurse Midwife

## 2023-12-08 VITALS — BP 113/62 | HR 92 | Wt 173.5 lb

## 2023-12-08 DIAGNOSIS — Z3A23 23 weeks gestation of pregnancy: Secondary | ICD-10-CM

## 2023-12-08 DIAGNOSIS — Z114 Encounter for screening for human immunodeficiency virus [HIV]: Secondary | ICD-10-CM

## 2023-12-08 DIAGNOSIS — Z113 Encounter for screening for infections with a predominantly sexual mode of transmission: Secondary | ICD-10-CM

## 2023-12-08 DIAGNOSIS — D573 Sickle-cell trait: Secondary | ICD-10-CM

## 2023-12-08 DIAGNOSIS — Z3480 Encounter for supervision of other normal pregnancy, unspecified trimester: Secondary | ICD-10-CM

## 2023-12-08 DIAGNOSIS — Z131 Encounter for screening for diabetes mellitus: Secondary | ICD-10-CM

## 2023-12-08 DIAGNOSIS — O358XX0 Maternal care for other (suspected) fetal abnormality and damage, not applicable or unspecified: Secondary | ICD-10-CM

## 2023-12-08 DIAGNOSIS — Z3482 Encounter for supervision of other normal pregnancy, second trimester: Secondary | ICD-10-CM

## 2023-12-08 DIAGNOSIS — O402XX1 Polyhydramnios, second trimester, fetus 1: Secondary | ICD-10-CM

## 2023-12-08 DIAGNOSIS — Z369 Encounter for antenatal screening, unspecified: Secondary | ICD-10-CM

## 2023-12-08 MED ORDER — PRENATAL 27-0.8 MG PO TABS: 1.0000 | ORAL_TABLET | Freq: Every day | ORAL | 10 refills | Status: DC

## 2023-12-08 NOTE — Progress Notes (Unsigned)
    Return Prenatal Note   Subjective   23 y.o. G1P0 at [redacted]w[redacted]d presents for this follow-up prenatal visit.  Patient feeling well, concerned about weight gain, reviewed diet & encouraged decreased fast food. Has MFM follow up scheduled-MFM to determine site of delivery due to concern for TRAP Patient reports: Movement: Present Contractions: Not present  Objective   Flow sheet Vitals: Pulse Rate: 92 BP: 113/62 Fundal Height: 24 cm Fetal Heart Rate (bpm): 150 Total weight gain: 28 lb 8 oz (12.9 kg)  General Appearance  No acute distress, well appearing, and well nourished Pulmonary   Normal work of breathing Neurologic   Alert and oriented to person, place, and time Psychiatric   Mood and affect within normal limits  Assessment/Plan   Plan  23 y.o. G1P0 at [redacted]w[redacted]d presents for follow-up OB visit. Reviewed prenatal record including previous visit note.  Supervision of other normal pregnancy, antepartum Anticipatory guidance for 28w labs reviewed. Continue MFM followup, will continue routine prenatal care at [facility] for time being-as pregnancy progresses MFM to determine appropriate delivery site.      Orders Placed This Encounter  Procedures   28 Week RH+Panel    Standing Status:   Future    Expected Date:   01/07/2024    Expiration Date:   12/07/2024   Return in 4 weeks (on 01/05/2024) for ROB & GDM screening.   Future Appointments  Date Time Provider Department Center  12/16/2023  1:00 PM [facility] PROVIDER 1 [facility]  12/16/2023  1:30 PM [facility] US4 [facility]  12/30/2023  2:00 PM [facility] PROVIDER 1 [facility]  12/30/2023  2:30 PM [facility] US1 [facility]  01/05/2024  9:00 AM [facility]N LAB [facility] None  01/05/2024  9:35 AM Swanson, Barbra Boone, CNM [facility] None  01/13/2024  1:00 PM [facility] PROVIDER 1 [facility]  01/13/2024  1:30 PM [facility] US2 [facility]    For next visit:  continue with routine prenatal care     Forestine Igo, CNM  12/08/23 2:24 PM

## 2023-12-08 NOTE — Patient Instructions (Signed)
 Gestational Diabetes Mellitus, Diagnosis Gestational diabetes mellitus, or gestational diabetes, is diabetes that some people get when pregnant. This condition usually happens at 24-28 weeks of pregnancy.  People with gestational diabetes have high blood sugar. If you do not get treated for this condition, it may cause problems for you and your baby. It goes away after you give birth but can happen again the next time you become pregnant. What are the causes? This condition is caused by changes in your body when you're pregnant. When these changes happen: The pancreas may not make enough insulin. The body may not use insulin in the right way. This is called insulin resistance. Insulin helps your body use sugar for energy. If your body doesn't have enough insulin or can't use the insulin that it has, extra sugars stay in your blood. This leads to high blood sugar (hyperglycemia). What increases the risk? Being older than age 93 when pregnant. Too much body weight. Having polycystic ovary syndrome (PCOS). Having someone with diabetes in your family. Having had this condition in the past. Being pregnant with more than one baby. What are the signs or symptoms? You may not have any symptoms. If you do have symptoms, they may include: Being thirsty often. Being hungry often. Needing to pee more often. These symptoms can be missed because they're similar to other symptoms of pregnancy. How is this diagnosed? This condition may be diagnosed based on your blood sugar level and how your body responds to glucose. This may be checked with an oral glucose tolerance test (OGTT). The test may be done: Early in pregnancy if you have risk factors. At 24-28 weeks of your pregnancy. How is this treated? To treat this condition, you may be told to: Eat a healthy diet. Get more exercise. Check your blood sugar often. Your health care provider will tell you what your target is. Take insulin and other  medicines. These are taken if needed. Work with a diabetes expert. Follow these instructions at home: Learn about your diabetes Ask your provider: How often should I check my blood sugar? Where do I get the equipment? What medicines do I need? When should I take them? Do I need to meet with an educator? Who can I call if I have questions? Where can I find a support group? General instructions Take medicines only as told by your provider. Stay at a healthy weight. Drink enough fluid to keep your pee (urine) pale yellow. Check your pee for ketones when sick and as told. Ketones in your pee is a sign that your body is using fat for energy because it's not making enough insulin. Wear an alert bracelet or carry a card that shows you have this condition. Keep all follow-up visits. Your provider needs to check your health and your baby's growth. Where to find more information Gestational Diabetes: American Diabetes Association (ADA): diabetes.org Gestational Diabetes and Pregnancy: Centers for Disease Control and Prevention (CDC): TonerPromos.no American Pregnancy Association: americanpregnancy.org [place] MyPlate: WrestlingReporter.dk Contact a health care provider if:  Your blood sugar is above your target for two tests in a row. You have a high blood sugar level and you also have ketones in your pee. You have been sick or have had a fever for 2 days or more and aren't getting better. You have any of these problems for more than 6 hours: You can't eat or drink. You vomit or feel like you may vomit. You have watery poop (diarrhea). Get help right away if:  You become confused or cannot think clearly. You have trouble breathing. You have chest pain. Your baby is moving less than usual. You have unusual discharge or bleeding from your vagina. You have cramping in your belly or have pain in your hips or lower back. You have symptoms of high blood pressure or preeclampsia. These include: A severe,  throbbing headache that doesn't go away. Sudden or extreme swelling of your face, hands, legs, or feet. Vision problems, such as: Seeing spots. Blurry vision. Sensitivity to light. These symptoms may be an emergency. Get help right away. Call 911. Do not wait to see if the symptoms will go away. Do not drive yourself to the hospital. This information is not intended to replace advice given to you by your health care provider. Make sure you discuss any questions you have with your health care provider. Document Revised: 03/19/2023 Document Reviewed: 09/26/2022 Elsevier Patient Education  2024 Else[employer] Inc. Second Trimester of Pregnancy  The second trimester of pregnancy is from week 14 through week 27. This is months 4 through 6 of pregnancy. During the second trimester: Morning sickness is less or has stopped. You may have more energy. You may feel hungry more often. At this time, your unborn baby is growing very fast. At the end of the sixth month, the unborn baby may be up to 12 inches long and weigh about 1 pounds. You will likely start to feel the baby move between 16 and 20 weeks of pregnancy. Body changes during your second trimester Your body continues to change during this time. The changes usually go away after your baby is born. Physical changes You will gain more weight. Your belly will get bigger. You may begin to get stretch marks on your hips, belly, and breasts. Your breasts will keep growing and may hurt. You may get dark spots or blotches on your face. A dark line from your belly button to the pubic area may appear. This line is called linea nigra. Your hair may grow faster and get thicker. Health changes You may have headaches. You may have heartburn. You may pee more often. You may have swollen, bulging veins (varicose veins). You may have trouble pooping (constipation), or swollen veins in the butt that can itch or get painful (hemorrhoids). You may have back  pain. This is caused by: Weight gain. Pregnancy hormones that are relaxing the joints in your pelvis. Follow these instructions at home: Medicines Talk to your health care provider if you're taking medicines. Ask if the medicines are safe to take during pregnancy. Your provider may change the medicines that you take. Do not take any medicines unless told to by your provider. Take a prenatal vitamin that has at least 600 micrograms (mcg) of folic acid. Do not use herbal medicines, illegal drugs, or medicines that are not approved by your provider. Eating and drinking While you're pregnant your body needs extra food for your growing baby. Talk with your provider about what to eat while pregnant. Activity Most women are able to exercise during pregnancy. Exercises may need to change as your pregnancy goes on. Talk to your provider about your activities and exercise routines. Relieving pain and discomfort Wear a good, supportive bra if your breasts hurt. Rest with your legs raised if you have leg cramps or low back pain. Take warm sitz baths to soothe pain from hemorrhoids. Use hemorrhoid cream if your provider says it's okay. Do not douche. Do not use tampons or scented pads. Do not  use hot tubs, steam rooms, or saunas. Safety Wear your seatbelt at all times when you're in a car. Talk to your provider if someone hits you, hurts you, or yells at you. Talk with your provider if you're feeling sad or have thoughts of hurting yourself. Lifestyle Certain things can be harmful while you're pregnant. It's best to avoid the following: Do not drink alcohol,smoke, vape, or use products with nicotine or tobacco in them. If you need help quitting, talk with your provider. Avoid cat litter boxes and soil used by cats. These things carry germs that can cause harm to your pregnancy and your baby. General instructions Keep all follow-up visits. It helps you and your unborn baby stay as healthy as  possible. Write down your questions. Take them to your prenatal visits. Your provider will: Talk with you about your overall health. Give you advice or refer you to specialists who can help with different needs, including: Prenatal education classes. Mental health and counseling. Foods and healthy eating. Ask for help if you need help with food. Where to find more information American Pregnancy Association: americanpregnancy.org [employer]necologists: acog.org Office on [employer] Health: TravelLesson.ca Contact a health care provider if: You have a headache that does not go away when you take medicine. You have any of these problems: You can't eat or drink. You throw up or feel like you may throw up. You have watery poop (diarrhea) for 2 days or more. You have pain when you pee or your pee smells bad. You have been sick for 2 days or more and are not getting better. Contact your provider right away if: You have any of these coming from your vagina: Abnormal discharge. Bad-smelling fluid. Bleeding. Your baby is moving less than usual. You have contractions, belly cramping, or have pain in your pelvis or lower back. You have symptoms of high blood pressure or preeclampsia. These include: A severe, throbbing headache that does not go away. Sudden or extreme swelling of your face, hands, legs, or feet. Vision problems: You see spots. You have blurry vision. Your eyes are sensitive to light. If you can't reach the provider, go to an urgent care or emergency room. Get help right away if: You faint, become confused, or can't think clearly. You have chest pain or trouble breathing. You have any kind of injury, such as from a fall or a car crash. These symptoms may be an emergency. Call 911 right away. Do not wait to see if the symptoms will go away. Do not drive yourself to the hospital. This information is not intended to replace advice given to you by  your health care provider. Make sure you discuss any questions you have with your health care provider. Document Revised: 03/19/2023 Document Reviewed: 10/17/2022 Elsevier Patient Education  2024 [employer].

## 2023-12-09 DIAGNOSIS — O358XX0 Maternal care for other (suspected) fetal abnormality and damage, not applicable or unspecified: Secondary | ICD-10-CM | POA: Insufficient documentation

## 2023-12-09 DIAGNOSIS — O402XX1 Polyhydramnios, second trimester, fetus 1: Secondary | ICD-10-CM | POA: Insufficient documentation

## 2023-12-09 NOTE — Assessment & Plan Note (Signed)
 Anticipatory guidance for 28w labs reviewed. Continue MFM followup, will continue routine prenatal care at [facility] for time being-as pregnancy progresses MFM to determine appropriate delivery site.

## 2023-12-16 ENCOUNTER — Other Ambulatory Visit: Payer: Self-pay | Admitting: *Deleted

## 2023-12-16 ENCOUNTER — Ambulatory Visit: Payer: Self-pay | Attending: Maternal & Fetal Medicine

## 2023-12-16 ENCOUNTER — Ambulatory Visit (HOSPITAL_BASED_OUTPATIENT_CLINIC_OR_DEPARTMENT_OTHER): Payer: Self-pay | Admitting: Obstetrics and Gynecology

## 2023-12-16 VITALS — BP 107/60 | HR 105

## 2023-12-16 DIAGNOSIS — O402XX1 Polyhydramnios, second trimester, fetus 1: Secondary | ICD-10-CM

## 2023-12-16 DIAGNOSIS — O358XX0 Maternal care for other (suspected) fetal abnormality and damage, not applicable or unspecified: Secondary | ICD-10-CM | POA: Insufficient documentation

## 2023-12-16 DIAGNOSIS — O402XX0 Polyhydramnios, second trimester, not applicable or unspecified: Secondary | ICD-10-CM | POA: Insufficient documentation

## 2023-12-16 DIAGNOSIS — O30032 Twin pregnancy, monochorionic/diamniotic, second trimester: Secondary | ICD-10-CM

## 2023-12-16 DIAGNOSIS — Z3A24 24 weeks gestation of pregnancy: Secondary | ICD-10-CM | POA: Insufficient documentation

## 2023-12-16 DIAGNOSIS — Z3480 Encounter for supervision of other normal pregnancy, unspecified trimester: Secondary | ICD-10-CM | POA: Diagnosis present

## 2023-12-16 DIAGNOSIS — O99012 Anemia complicating pregnancy, second trimester: Secondary | ICD-10-CM

## 2023-12-16 DIAGNOSIS — Z148 Genetic carrier of other disease: Secondary | ICD-10-CM | POA: Diagnosis not present

## 2023-12-16 DIAGNOSIS — Z3A27 27 weeks gestation of pregnancy: Secondary | ICD-10-CM

## 2023-12-16 DIAGNOSIS — Z362 Encounter for other antenatal screening follow-up: Secondary | ICD-10-CM | POA: Diagnosis not present

## 2023-12-16 DIAGNOSIS — O358XX1 Maternal care for other (suspected) fetal abnormality and damage, fetus 1: Secondary | ICD-10-CM | POA: Diagnosis not present

## 2023-12-16 DIAGNOSIS — D730 Hyposplenism: Secondary | ICD-10-CM

## 2023-12-16 NOTE — Progress Notes (Signed)
 Maternal-Fetal Medicine Consultation Name: Brandi [name] MRN: [id number]   G1 P0 at 22w 5d gestation.  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 Patient feels well and reports no uterine contractions or vaginal bleeding.   Ultrasound In the pump twin, fetal growth is appropriate for gestational age.  Mild polyhydramnios is seen. Good fetal activity seen.  No evidence of pleural-or pericardial effusions.  No evidence of fetal hydrops.  Middle cerebral artery Doppler showed normal peak systolic velocity measurements (no evidence of fetal anemia).  A thin-dividing membrane confirming monochorionic placentation is seen.   The amorphous structure consistent with acardiac twin is seen again and it measures 6.8 x 3.5 x 5.5 cm, and this appears to be essentially-unchanged from previous scan.  The calculated weight of acardiac twin using the formula mentioned in our previous report is 44 grams and the weight ratio with that of the pump twin is 5% (far less than the 50% that would prompt intervention). Communicating vessels from the pump twin to the acardiac twin are seen along the placenta.  I counseled the patient on the encouraging findings and that intervention is not indicated now.  I discussed timing of delivery.  Because of monochorionic presentation with TRAP sequence, delivery at [redacted] weeks gestation is reasonable.  Recommendations - Limited ultrasound every 2 weeks and fetal growth assessment every 4 weeks. - Weekly BPP from [redacted] weeks gestation until delivery. - Delivery at [redacted] weeks gestation.

## 2023-12-30 ENCOUNTER — Ambulatory Visit (HOSPITAL_BASED_OUTPATIENT_CLINIC_OR_DEPARTMENT_OTHER): Payer: Self-pay | Admitting: Maternal & Fetal Medicine

## 2023-12-30 ENCOUNTER — Other Ambulatory Visit: Payer: Self-pay | Admitting: Obstetrics and Gynecology

## 2023-12-30 ENCOUNTER — Ambulatory Visit: Payer: Self-pay | Attending: Obstetrics and Gynecology

## 2023-12-30 VITALS — BP 120/64 | HR 98

## 2023-12-30 DIAGNOSIS — O30032 Twin pregnancy, monochorionic/diamniotic, second trimester: Secondary | ICD-10-CM

## 2023-12-30 DIAGNOSIS — Z3A26 26 weeks gestation of pregnancy: Secondary | ICD-10-CM

## 2023-12-30 DIAGNOSIS — O358XX1 Maternal care for other (suspected) fetal abnormality and damage, fetus 1: Secondary | ICD-10-CM | POA: Insufficient documentation

## 2023-12-30 DIAGNOSIS — D573 Sickle-cell trait: Secondary | ICD-10-CM

## 2023-12-30 DIAGNOSIS — O358XX0 Maternal care for other (suspected) fetal abnormality and damage, not applicable or unspecified: Secondary | ICD-10-CM | POA: Diagnosis not present

## 2023-12-30 DIAGNOSIS — O99012 Anemia complicating pregnancy, second trimester: Secondary | ICD-10-CM

## 2023-12-30 DIAGNOSIS — O402XX0 Polyhydramnios, second trimester, not applicable or unspecified: Secondary | ICD-10-CM

## 2023-12-30 NOTE — Progress Notes (Signed)
 Patient information  Patient Name: Brandi [name] [facility]  Patient MRN:   [id number]  Referring practice: MFM Referring Provider: [facility]  Problem List   Patient Active Problem List   Diagnosis Date Noted   Polyhydramnios-MVP 8.7 at 22w 12/09/2023   Twin reversed-arterial perfusion (TRAP) sequence pregnancy, antepartum 12/09/2023   Sickle cell trait (HCC) 10/12/2023   Abnormal fetal ultrasound 09/21/2023   Supervision of other normal pregnancy, antepartum 08/28/2023   Vitamin D  deficiency 01/21/2023   Low TSH level 06/06/2022    Maternal Fetal medicine Consult  Brandi [name] is a 23 y.o. G1P0 at [redacted]w[redacted]d here for ultrasound and consultation. Brandi [name] is doing well today with no acute concerns. Today we focused on the following:   Acardiac twin: Today the ultrasound appears reassuring.  The acardiac twin is very small in size only weighing approximately 17 g.  The MCA Dopplers are normal and the remaining live fetus.  There is no evidence of hydrops.  She will continue to follow-up as scheduled every 2 weeks until 32 weeks then she can be seen weekly for antenatal testing  Polyhydramnios. Polyhydramnios was seen on today's ultrasound. There cardiac anatomy appears normal. There is no evidence of chorioangioma. The fetal movement is normal without evidence of arthrogryposis. The fetal stomach and bladder appears normal. The fetal palate (although limited in views) and neck appears to be normal without evidence of mass. There is no evidence of of lower spine or pelvic abnormalities.   I discussed the diagnosis, management, prognosis and clinical implications of polyhydramnios and the clinical implications during pregnancy. Amniotic fluid is made from the fetal kidneys and at the normal fluid ranges from 5 to 25 cm on amniotic fluid index.  Potential causes of elevated amniotic fluid in pregnancy. I discussed that most the time the cause is unknown but the most  common cause that is identifiable is maternal diabetes.  Also discussed that there is a potential for birth defects or neurologic conditions that can result in elevated amniotic fluid. Potential complications associated with polyhydramnios include malpresentation, increased risk of cord prolapse, increased discomfort during the end of pregnancy, increased risk of preterm contractions and increased risk of stillbirth.  In particular the risk of fetal anomalies and neonatal abnormalities increases with the degree of polyhydramnios.  Approximately 1% of newborns and 5 to 10% of fetuses with mild polyhydramnios will have an abnormality (tracheoesophageal fistula are among the most common abnormalities associated with polyhydramnios).  This increases to 2-5 times a risk with moderate and severe polyhydramnios.  Currently there is no treatment of polyhydramnios if it is idiopathic or due to fetal abnormalities.  If the elevation in fluid is related to maternal diabetes, better glucose control should reduce the degree of polyhydramnios from osmotic diuresis.   The patient had time to ask questions that were answered to her satisfaction.  She verbalized understanding and agrees to proceed with the plan below.  Sonographic findings Single intrauterine pregnancy at 26w 5d with a coexisting acardiac twin. Fetal cardiac activity:  Observed and appears normal. Presentation: Cephalic. Interval fetal anatomy appears normal. Fetal biometry shows the estimated fetal weight at the 83 percentile. Amniotic fluid volume: Polyhydramnios. MVP: 9.09 cm. Placenta: Anterior. MCA Dopplers: 1.16 MOM The acardiac twin was visualized again today and measures 4.55 cm in the longest diameter.  The estimated weight of this mass is about 17 g.  There are limitations of prenatal ultrasound such as the inability to detect certain  abnormalities due to poor visualization. Various factors such as fetal position, gestational age and  maternal body habitus may increase the difficulty in visualizing the fetal anatomy.    Recommendations - Continue ultrasound every 2 weeks to assess MCA Dopplers and for evidence of hydrops.  Antenatal testing around 32 weeks.  Review of Systems: A review of systems was performed and was negative except per HPI   Vitals and Physical Exam    12/30/2023    2:14 PM 12/16/2023    1:11 PM 12/08/2023    2:02 PM  Vitals with BMI  Weight   173 lbs 8 oz  Systolic 120 107 886  Diastolic 64 60 62  Pulse 98 105 92    Sitting comfortably on the sonogram table Nonlabored breathing Normal rate and rhythm Abdomen is nontender  Past pregnancies OB History  Gravida Para Term Preterm AB Living  1       SAB IAB Ectopic Multiple Live Births          # Outcome Date GA Lbr Len/2nd Weight Sex Type Anes PTL Lv  1 Current              I spent 20 minutes reviewing the patients chart, including labs and images as well as counseling the patient about her medical conditions. Greater than 50% of the time was spent in direct face-to-face patient counseling.  Delora Smaller  MFM, [facility]   12/30/2023  4:27 PM

## 2024-01-04 ENCOUNTER — Other Ambulatory Visit: Payer: Self-pay

## 2024-01-04 ENCOUNTER — Observation Stay
Admission: EM | Admit: 2024-01-04 | Discharge: 2024-01-04 | Disposition: A | Discharge status: Home / Self Care | Payer: Self-pay | Attending: Certified Nurse Midwife | Admitting: Certified Nurse Midwife

## 2024-01-04 ENCOUNTER — Encounter: Payer: Self-pay | Admitting: Obstetrics and Gynecology

## 2024-01-04 ENCOUNTER — Ambulatory Visit: Payer: Self-pay | Admitting: Certified Nurse Midwife

## 2024-01-04 DIAGNOSIS — O358XX0 Maternal care for other (suspected) fetal abnormality and damage, not applicable or unspecified: Secondary | ICD-10-CM

## 2024-01-04 DIAGNOSIS — Z3A27 27 weeks gestation of pregnancy: Secondary | ICD-10-CM

## 2024-01-04 DIAGNOSIS — N898 Other specified noninflammatory disorders of vagina: Secondary | ICD-10-CM | POA: Diagnosis present

## 2024-01-04 DIAGNOSIS — O26892 Other specified pregnancy related conditions, second trimester: Principal | ICD-10-CM

## 2024-01-04 DIAGNOSIS — Z3480 Encounter for supervision of other normal pregnancy, unspecified trimester: Principal | ICD-10-CM

## 2024-01-04 DIAGNOSIS — O402XX1 Polyhydramnios, second trimester, fetus 1: Secondary | ICD-10-CM

## 2024-01-04 DIAGNOSIS — B9689 Other specified bacterial agents as the cause of diseases classified elsewhere: Secondary | ICD-10-CM

## 2024-01-04 DIAGNOSIS — N760 Acute vaginitis: Secondary | ICD-10-CM

## 2024-01-04 LAB — URINALYSIS, ROUTINE W REFLEX MICROSCOPIC
Bilirubin Urine: NEGATIVE
Glucose, UA: NEGATIVE mg/dL
Hgb urine dipstick: NEGATIVE
Ketones, ur: NEGATIVE mg/dL
Nitrite: NEGATIVE
Protein, ur: NEGATIVE mg/dL
Specific Gravity, Urine: 1.011 (ref 1.005–1.030)
pH: 6 (ref 5.0–8.0)

## 2024-01-04 LAB — WET PREP, GENITAL
Sperm: NONE SEEN
Trich, Wet Prep: NONE SEEN
WBC, Wet Prep HPF POC: >=10 — AB (ref <10)
Yeast Wet Prep HPF POC: NONE SEEN

## 2024-01-04 LAB — CHLAMYDIA/NGC RT PCR (ARMC ONLY)
Chlamydia Tr: NOT DETECTED
N gonorrhoeae: NOT DETECTED

## 2024-01-04 MED ORDER — METRONIDAZOLE 500 MG PO TABS
500.0000 mg | ORAL_TABLET | Freq: Once | ORAL | Status: AC
  Administered 2024-01-04: 500 mg via ORAL
  Filled 2024-01-04: qty 1

## 2024-01-04 MED ORDER — METRONIDAZOLE 500 MG PO TABS
500.0000 mg | ORAL_TABLET | Freq: Two times a day (BID) | ORAL | 0 refills | Status: AC
Start: 2024-01-04 — End: 2024-01-11

## 2024-01-04 NOTE — Discharge Summary (Signed)
 LABOR & DELIVERY OB TRIAGE NOTE  SUBJECTIVE  HPI Brandi [name] is a 23 y.o. G1P0 at [redacted]w[redacted]d who presents to Labor & Delivery for change to color & amount of vaginal discharge. She reports she noticed an increased in discharge Saturday as well as some mild cramping that resolved. Overnight she reports feeling a gush & when she got up to the bathroom she had a mucoid discharge in her underwear & with wiping, yellow to green, denies odor. Denies contractions, loss of fluid, vaginal bleeding. Endorses usual fetal movement.  Pregnancy complicated by acardiac twin with TRAP sequence-followed by MFM with biweekly ultrasound, polyhydramnios in 2nd trimester  OB History     Gravida  1   Para      Term      Preterm      AB      Living         SAB      IAB      Ectopic      Multiple      Live Births              Scheduled Meds:  metroNIDAZOLE   500 mg Oral Once   Continuous Infusions: PRN Meds:.  OBJECTIVE  BP 124/79 (BP Location: Left Arm)   Pulse 83   Temp 98.9 F (37.2 C) (Oral)   Resp 16   LMP 06/26/2023 (Exact Date)   General: A&Ox4, NAD Heart: regular rate Lungs: normal work of breathing Abdomen: soft, gravid, nontender Cervical exam:   deferred  NST I reviewed the NST and it was reactive & appropriate for gestational age.  Baseline: 130 Variability: moderate Accelerations: present Decelerations: absent Toco: irritability Category I  Results for orders placed or performed during the hospital encounter of 01/04/24 (from the past 24 hours)  Wet prep, genital     Status: Abnormal   Collection Time: 01/04/24  4:25 AM  Result Value Ref Range   Yeast Wet Prep HPF POC NONE SEEN NONE SEEN   Trich, Wet Prep NONE SEEN NONE SEEN   Clue Cells Wet Prep HPF POC PRESENT (A) NONE SEEN   WBC, Wet Prep HPF POC >=10 (A) <10   Sperm NONE SEEN   Urinalysis, Routine w reflex microscopic -Urine, Clean Catch     Status: Abnormal   Collection Time: 01/04/24   4:27 AM  Result Value Ref Range   Color, Urine YELLOW (A) YELLOW   APPearance CLEAR (A) CLEAR   Specific Gravity, Urine 1.011 1.005 - 1.030   pH 6.0 5.0 - 8.0   Glucose, UA NEGATIVE NEGATIVE mg/dL   Hgb urine dipstick NEGATIVE NEGATIVE   Bilirubin Urine NEGATIVE NEGATIVE   Ketones, ur NEGATIVE NEGATIVE mg/dL   Protein, ur NEGATIVE NEGATIVE mg/dL   Nitrite NEGATIVE NEGATIVE   Leukocytes,Ua TRACE (A) NEGATIVE   RBC / HPF 0-5 0 - 5 RBC/hpf   WBC, UA 0-5 0 - 5 WBC/hpf   Bacteria, UA RARE (A) NONE SEEN   Squamous Epithelial / HPF 0-5 0 - 5 /HPF   Mucus PRESENT     No results found.  ASSESSMENT Impression  1) Pregnancy at G1P0, [redacted]w[redacted]d, Estimated Date of Delivery: 04/01/24 2) Reassuring maternal/fetal status 3) BV in pregnancy  PLAN Discharge home with preterm labor & fetal movement precautions. First dose flagyl  prior to discharge. Will follow up Gc/Ct results. Maintain appointment as scheduled tomorrow for ROB & 28w labs. Harlene LITTIE Cisco, CNM 01/04/24  6:12 AM

## 2024-01-04 NOTE — OB Triage Note (Signed)
 Patient is a 23 yo, G1P0, at 27 weeks and 3 days. Patient presents with complaints of abnormal discharge. Pt reports having an increase in discharge for the past 24 hours and noticed this morning that it was a yellow/brownish color. She reported having abdominal cramping starting Friday night but ended Saturday morning and is no longer experiencing any pain. Pt reports getting her care at maternal fetal care in [place] due to having an acardiac twin. Patient denies any vaginal bleeding or LOF. Patient reports +FM. Monitors applied and assessing. VSS. Initial fetal heart tone is 140. Brandi [name] CNM notified of patients arrival to unit. Plan to do wet prep, UA, G/C

## 2024-01-05 ENCOUNTER — Ambulatory Visit: Payer: Self-pay | Admitting: Obstetrics

## 2024-01-05 ENCOUNTER — Other Ambulatory Visit: Payer: Self-pay

## 2024-01-05 VITALS — BP 105/63 | HR 87 | Wt 182.5 lb

## 2024-01-05 DIAGNOSIS — Z114 Encounter for screening for human immunodeficiency virus [HIV]: Secondary | ICD-10-CM | POA: Diagnosis not present

## 2024-01-05 DIAGNOSIS — Z131 Encounter for screening for diabetes mellitus: Secondary | ICD-10-CM

## 2024-01-05 DIAGNOSIS — Z3480 Encounter for supervision of other normal pregnancy, unspecified trimester: Secondary | ICD-10-CM | POA: Diagnosis not present

## 2024-01-05 DIAGNOSIS — O318X20 Other complications specific to multiple gestation, second trimester, not applicable or unspecified: Secondary | ICD-10-CM

## 2024-01-05 DIAGNOSIS — D573 Sickle-cell trait: Secondary | ICD-10-CM

## 2024-01-05 DIAGNOSIS — O402XX1 Polyhydramnios, second trimester, fetus 1: Secondary | ICD-10-CM

## 2024-01-05 DIAGNOSIS — Z369 Encounter for antenatal screening, unspecified: Secondary | ICD-10-CM | POA: Diagnosis not present

## 2024-01-05 DIAGNOSIS — O283 Abnormal ultrasonic finding on antenatal screening of mother: Secondary | ICD-10-CM

## 2024-01-05 DIAGNOSIS — O358XX0 Maternal care for other (suspected) fetal abnormality and damage, not applicable or unspecified: Secondary | ICD-10-CM

## 2024-01-05 DIAGNOSIS — O318X21 Other complications specific to multiple gestation, second trimester, fetus 1: Secondary | ICD-10-CM

## 2024-01-05 DIAGNOSIS — Z113 Encounter for screening for infections with a predominantly sexual mode of transmission: Secondary | ICD-10-CM | POA: Diagnosis not present

## 2024-01-05 DIAGNOSIS — Z3A27 27 weeks gestation of pregnancy: Secondary | ICD-10-CM

## 2024-01-05 NOTE — Progress Notes (Addendum)
    Return Prenatal Note   Assessment/Plan   Plan  23 y.o. G1P0 at [redacted]w[redacted]d presents for follow-up OB visit. Reviewed prenatal record including previous visit note.  Supervision of other normal pregnancy, antepartum - Gestational diabetes screening, third trimester labs, and BTCF completed today. TDAP discussed and will offer again at next appointment.  - Discussed discharge and to continue taking Flagyl  for BV, and that discharge may take 48 to 72 hours to start clearing up.  -Reviewed kick counts and preterm labor warning signs. Instructed to call office or come to hospital with persistent headache, vision changes, regular contractions, leaking of fluid, decreased fetal movement or vaginal bleeding.     Twin reversed-arterial perfusion (TRAP) sequence pregnancy, antepartum - Following up with MFM every other week until 32 weeks and then weekly.     No orders of the defined types were placed in this encounter.  Return in about 2 weeks (around 01/19/2024).   Future Appointments  Date Time Provider Department Center  01/13/2024  1:00 PM [facility] PROVIDER 1 [facility]  01/13/2024  1:30 PM [facility] US2 [facility]  01/19/2024 10:15 AM Leigh Sober, MD [facility] None  01/28/2024 11:15 AM [facility] PROVIDER 1 [facility]  01/28/2024 11:30 AM [facility] US5 [facility]  02/11/2024 11:15 AM [facility] PROVIDER 1 [facility]  02/11/2024 11:30 AM [facility] US2 [facility]    For next visit:  Routine prenatal care    Subjective  Chaunte is feeling well overall. She is still having heavy vaginal discharge and is taking her Flagyl .   Movement: Present Contractions: Not present  Objective   Flow sheet Vitals: Pulse Rate: 87 BP: 105/63 Fundal Height: 28 cm Fetal Heart Rate (bpm): 134 Total weight gain: 37 lb 8 oz (17 kg)  General Appearance  No acute distress, well appearing, and well nourished Pulmonary   Normal work of breathing Neurologic   Alert and oriented to person, place, and  time Psychiatric   Mood and affect within normal limits  Rosina Hamilton, [facility] 01/05/24 11:22 AM

## 2024-01-05 NOTE — Assessment & Plan Note (Signed)
-   Following up with MFM every other week until 32 weeks and then weekly.

## 2024-01-05 NOTE — Assessment & Plan Note (Addendum)
-   Gestational diabetes screening, third trimester labs, and BTCF completed today. TDAP discussed and will offer again at next appointment.  - Discussed discharge and to continue taking Flagyl  for BV, and that discharge may take 48 to 72 hours to start clearing up.  -Reviewed kick counts and preterm labor warning signs. Instructed to call office or come to hospital with persistent headache, vision changes, regular contractions, leaking of fluid, decreased fetal movement or vaginal bleeding.

## 2024-01-06 ENCOUNTER — Ambulatory Visit: Payer: Self-pay | Admitting: Certified Nurse Midwife

## 2024-01-06 LAB — 28 WEEK RH+PANEL
Basophils Absolute: 0 x10E3/uL (ref 0.0–0.2)
Basos: 1 %
EOS (ABSOLUTE): 0.1 x10E3/uL (ref 0.0–0.4)
Eos: 2 %
Gestational Diabetes Screen: 91 mg/dL (ref 70–139)
HIV Screen 4th Generation wRfx: NONREACTIVE
Hematocrit: 32.3 % — ABNORMAL LOW (ref 34.0–46.6)
Hemoglobin: 10.7 g/dL — ABNORMAL LOW (ref 11.1–15.9)
Immature Grans (Abs): 0 x10E3/uL (ref 0.0–0.1)
Immature Granulocytes: 0 %
Lymphocytes Absolute: 1.2 x10E3/uL (ref 0.7–3.1)
Lymphs: 22 %
MCH: 28.1 pg (ref 26.6–33.0)
MCHC: 33.1 g/dL (ref 31.5–35.7)
MCV: 85 fL (ref 79–97)
Monocytes Absolute: 0.4 x10E3/uL (ref 0.1–0.9)
Monocytes: 7 %
Neutrophils Absolute: 3.8 x10E3/uL (ref 1.4–7.0)
Neutrophils: 68 %
Platelets: 208 x10E3/uL (ref 150–450)
RBC: 3.81 x10E6/uL (ref 3.77–5.28)
RDW: 13 % (ref 11.7–15.4)
RPR Ser Ql: NONREACTIVE
WBC: 5.6 x10E3/uL (ref 3.4–10.8)

## 2024-01-13 ENCOUNTER — Ambulatory Visit (HOSPITAL_BASED_OUTPATIENT_CLINIC_OR_DEPARTMENT_OTHER): Payer: Self-pay | Admitting: Maternal & Fetal Medicine

## 2024-01-13 ENCOUNTER — Ambulatory Visit: Payer: Self-pay | Attending: Obstetrics and Gynecology

## 2024-01-13 VITALS — BP 114/62 | HR 122

## 2024-01-13 DIAGNOSIS — Z148 Genetic carrier of other disease: Secondary | ICD-10-CM | POA: Insufficient documentation

## 2024-01-13 DIAGNOSIS — Z3A28 28 weeks gestation of pregnancy: Secondary | ICD-10-CM | POA: Insufficient documentation

## 2024-01-13 DIAGNOSIS — O30033 Twin pregnancy, monochorionic/diamniotic, third trimester: Secondary | ICD-10-CM | POA: Diagnosis not present

## 2024-01-13 DIAGNOSIS — O358XX1 Maternal care for other (suspected) fetal abnormality and damage, fetus 1: Secondary | ICD-10-CM

## 2024-01-13 DIAGNOSIS — Z3480 Encounter for supervision of other normal pregnancy, unspecified trimester: Secondary | ICD-10-CM | POA: Diagnosis present

## 2024-01-13 DIAGNOSIS — O358XX0 Maternal care for other (suspected) fetal abnormality and damage, not applicable or unspecified: Secondary | ICD-10-CM

## 2024-01-13 DIAGNOSIS — Z363 Encounter for antenatal screening for malformations: Secondary | ICD-10-CM | POA: Insufficient documentation

## 2024-01-13 DIAGNOSIS — O402XX1 Polyhydramnios, second trimester, fetus 1: Secondary | ICD-10-CM | POA: Diagnosis not present

## 2024-01-13 DIAGNOSIS — O403XX0 Polyhydramnios, third trimester, not applicable or unspecified: Secondary | ICD-10-CM

## 2024-01-13 DIAGNOSIS — O403XX1 Polyhydramnios, third trimester, fetus 1: Secondary | ICD-10-CM | POA: Diagnosis not present

## 2024-01-13 NOTE — Progress Notes (Signed)
   Patient information  Patient Name: Brandi [name] [facility]  Patient MRN:   [id number]  Referring practice: MFM Referring Provider: [facility]  Problem List   Patient Active Problem List   Diagnosis Date Noted   Vaginal discharge during pregnancy 01/04/2024   Polyhydramnios-MVP 8.7 at 22w 12/09/2023   Twin reversed-arterial perfusion (TRAP) sequence pregnancy, antepartum 12/09/2023   Sickle cell trait (HCC) 10/12/2023   Abnormal fetal ultrasound 09/21/2023   Supervision of other normal pregnancy, antepartum 08/28/2023   Vitamin D  deficiency 01/21/2023   Low TSH level 06/06/2022    Maternal Fetal medicine Consult  Brandi [name] is a 23 y.o. G1P0 at [redacted]w[redacted]d here for ultrasound and consultation. Brandi [name] is doing well today with no acute concerns. Today we focused on the following:   Today the patient was seen due to concern for TRAP sequence.  The acardiac twin is measuring only 12 g which is not even 1% of the total fetal weight.  I discussed that this is unlikely to cause any problem with her current fetus given the concern is when the acardiac twin is measuring at least 25 to 30% of the fetal weight.  Therefore a very small amount of blood is going to the acardiac twin.  However, since there are no guidelines for this clinical situation we will continue to assess with ultrasound.  The patient had time to ask questions that were answered to her satisfaction.  She verbalized understanding and agrees to proceed with the plan below.  Sonographic findings Single intrauterine pregnancy at 28w 5d.  Fetal cardiac activity:  Observed and appears normal. Presentation: Cephalic. Interval fetal anatomy appears normal. Fetal biometry shows the estimated fetal weight at the 84 percentile. Amniotic fluid volume: Polyhydramnios. MVP: 8.95 cm. Placenta: Anterior. MCA dopplers are normal today with MoM of 1.4.   There are limitations of prenatal ultrasound such as the  inability to detect certain abnormalities due to poor visualization. Various factors such as fetal position, gestational age and maternal body habitus may increase the difficulty in visualizing the fetal anatomy.    Recommendations - Continue ultrasounds every 2 weeks to assess for any progression of hydrops (likely very low at this time) - Antenatal testing can occur around 32 weeks  Review of Systems: A review of systems was performed and was negative except per HPI   Vitals and Physical Exam    01/13/2024    1:11 PM 01/05/2024    9:05 AM 01/04/2024    3:54 AM  Vitals with BMI  Weight  182 lbs 8 oz   Systolic 114 105 875  Diastolic 62 63 79  Pulse 122 87 83    Sitting comfortably on the sonogram table Nonlabored breathing Normal rate and rhythm Abdomen is nontender  Past pregnancies OB History  Gravida Para Term Preterm AB Living  1       SAB IAB Ectopic Multiple Live Births          # Outcome Date GA Lbr Len/2nd Weight Sex Type Anes PTL Lv  1 Current              I spent 20 minutes reviewing the patients chart, including labs and images as well as counseling the patient about her medical conditions. Greater than 50% of the time was spent in direct face-to-face patient counseling.  Delora Smaller  MFM, [place]   01/13/2024  2:00 PM

## 2024-01-19 ENCOUNTER — Ambulatory Visit (INDEPENDENT_AMBULATORY_CARE_PROVIDER_SITE_OTHER): Payer: Self-pay | Admitting: Obstetrics

## 2024-01-19 VITALS — BP 115/70 | HR 98 | Wt 183.9 lb

## 2024-01-19 DIAGNOSIS — Z3A29 29 weeks gestation of pregnancy: Secondary | ICD-10-CM | POA: Diagnosis not present

## 2024-01-19 DIAGNOSIS — O43023 Fetus-to-fetus placental transfusion syndrome, third trimester: Secondary | ICD-10-CM

## 2024-01-19 DIAGNOSIS — O0993 Supervision of high risk pregnancy, unspecified, third trimester: Secondary | ICD-10-CM | POA: Diagnosis not present

## 2024-01-19 DIAGNOSIS — Z23 Encounter for immunization: Secondary | ICD-10-CM

## 2024-01-19 DIAGNOSIS — D573 Sickle-cell trait: Secondary | ICD-10-CM

## 2024-01-19 DIAGNOSIS — O358XX0 Maternal care for other (suspected) fetal abnormality and damage, not applicable or unspecified: Secondary | ICD-10-CM

## 2024-01-19 NOTE — Progress Notes (Signed)
    Return Prenatal Note   Subjective  23 y.o. G1P0 at [redacted]w[redacted]d presents for this follow-up prenatal visit. 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   Patient has no complaints today. She completed the flagyl  for BV prescribed in triage 01/04/24 and is feeling much better. Last MFM visit 7/16, EFW 84% and MVP 8.95 at [redacted]w[redacted]d. She had fetal echo completed 7/11 at [facility] and they are recommending delivery in [place]. Pt lives in [place], unsure if wanting to transfer prenatal care to delivering group, or continue here for location and meet delivering team 1-2 times.   Patient reports: Movement: Present Contractions: Not present Denies vaginal bleeding or leaking fluid. Objective  Flow sheet Vitals: Pulse Rate: 98 BP: 115/70 Fundal Height: 30 cm Fetal Heart Rate (bpm): 134 Total weight gain: 38 lb 14.4 oz (17.6 kg)  General Appearance  No acute distress, well appearing, and well nourished Pulmonary   Normal work of breathing Neurologic   Alert and oriented to person, place, and time Psychiatric   Mood and affect within normal limits   Assessment/Plan   Plan  23 y.o. G1P0 at [redacted]w[redacted]d presents for follow-up OB visit. Reviewed prenatal record including previous visit note. 1. Supervision of high risk pregnancy in third trimester (Primary) -Tdap vaccine done today  2. Acardiac twin during pregnancy, antepartum, single or unspecified fetus -Delivery recommended in [place] -Pt to let us  know her preference for either transferring care to delivering group, vs continuing care with [facility] and meeting delivering team 1-2 times prior to.  -Next MFM visit 7/31 for hydrops check -Weekly monitoring to start at 32wks  Orders Placed This Encounter  Procedures   Tdap vaccine greater than or equal to 7yo IM   Return in about 2 weeks (around 02/02/2024) for ROB.   Future Appointments  Date Time Provider Department Center  01/28/2024 11:15 AM  [facility] PROVIDER 1 [facility]  01/28/2024 11:30 AM [facility] US5 [facility]  02/05/2024  3:35 PM Lynda Bradley, CNM [facility] None  02/11/2024 11:15 AM [facility] PROVIDER 1 [facility]  02/11/2024 11:30 AM [facility] US2 [facility]    For next visit:  continue with routine prenatal care    Estil Mangle, DO [facility]

## 2024-01-19 NOTE — Patient Instructions (Signed)
 Third Trimester of Pregnancy  The third trimester of pregnancy is from week 28 through week 40. This is months 7 through 9. The third trimester is a time when your baby is growing fast. Body changes during your third trimester Your body continues to change during this time. The changes usually go away after your baby is born. Physical changes You will continue to gain weight. You may get stretch marks on your hips, belly, and breasts. Your breasts will keep growing and may hurt. A yellow fluid (colostrum) may leak from your breasts. This is the first milk you're making for your baby. Your hair may grow faster and get thicker. In some cases, you may get hair loss. Your belly button may stick out. You may have more swelling in your hands, face, or ankles. Health changes You may have heartburn. You may feel short of breath. This is caused by the uterus that is now bigger. You may have more aches in the pelvis, back, or thighs. You may have more tingling or numbness in your hands, arms, and legs. You may pee more often. You may have trouble pooping (constipation) or swollen veins in the butt that can itch or get painful (hemorrhoids). Other changes You may have more problems sleeping. You may notice the baby moving lower in your belly (dropping). You may have more fluid coming from your vagina. Your joints may feel loose, and you may have pain around your pelvic bone. Follow these instructions at home: Medicines Take medicines only as told by your health care provider. Some medicines are not safe during pregnancy. Your provider may change the medicines that you take. Do not take any medicines unless told to by your provider. Take a prenatal vitamin that has at least 600 micrograms (mcg) of folic acid. Do not use herbal medicines, illegal drugs, or medicines that are not approved by your provider. Eating and drinking While you're pregnant your body needs additional nutrition to help  support your growing baby. Talk with your provider about your nutritional needs. Activity Most women are able to exercise regularly during pregnancy. Exercise routines may need to change at the end of your pregnancy. Talk to your provider about your activities and exercise routine. Relieving pain and discomfort Rest often with your legs raised if you have leg cramps or low back pain. Take warm sitz baths to soothe pain from hemorrhoids. Use hemorrhoid cream if your provider says it's okay. Wear a good, supportive bra if your breasts hurt. Do not use hot tubs, steam rooms, or saunas. Do not douche. Do not use tampons or scented pads. Safety Talk to your provider before traveling far distances. Wear your seatbelt at all times when you're in a car. Talk to your provider if someone hits you, hurts you, or yells at you. Preparing for birth To prepare for your baby: Take childbirth and breastfeeding classes. Visit the hospital and tour the maternity area. Buy a rear-facing car seat. Learn how to install it in your car. General instructions Avoid cat litter boxes and soil used by cats. These things carry germs that can cause harm to your pregnancy and your baby. Do not drink alcohol, smoke, vape, or use products with nicotine or tobacco in them. If you need help quitting, talk with your provider. Keep all follow-up visits for your third trimester. Your provider will do more exams and tests during this trimester. Write down your questions. Take them to your prenatal visits. Your provider also will: Talk with you about  your overall health. Give you advice or refer you to specialists who can help with different needs, including: Mental health and counseling. Foods and healthy eating. Ask for help if you need help with food. Where to find more information American Pregnancy Association: americanpregnancy.org [employer]st[employer]ians [employer] Gynecologists: acog.org Office on [employer] Health:  [employer] Contact a health care provider if: You have a headache that does not go away when you take medicine. You have any of these problems: You can't eat or drink. You have nausea and vomiting. You have watery poop (diarrhea) for 2 days or more. You have pain when you pee, or your pee smells bad. You have been sick for 2 days or more and aren't getting better. Contact your provider right away if: You have any of these coming from your vagina: Abnormal discharge. Bad-smelling fluid. Bleeding. Your baby is moving less than usual. You have signs of labor: You have any contractions, belly cramping, or have pain in your pelvis or lower back before 37 weeks of pregnancy (preterm labor). You have regular contractions that are less than 5 minutes apart. Your water breaks. You have symptoms of high blood pressure or preeclampsia. These include: A severe, throbbing headache that does not go away. Sudden or extreme swelling of your face, hands, legs, or feet. Vision problems: You see spots. You have blurry vision. Your eyes are sensitive to light. If you can't reach your provider, go to an urgent care or emergency room. Get help right away if: You faint, become confused, or can't think clearly. You have chest pain or trouble breathing. You have any kind of injury, such as from a fall or a car crash. These symptoms may be an emergency. Call 911 right away. Do not wait to see if the symptoms will go away. Do not drive yourself to the hospital. This information is not intended to replace advice given to you by your health care provider. Make sure you discuss any questions you have with your health care provider. Document Revised: 03/19/2023 Document Reviewed: 10/17/2022 Elsevier Patient Education  2024 [employer].

## 2024-01-22 ENCOUNTER — Encounter: Payer: MEDICAID | Admitting: Nurse Practitioner

## 2024-01-23 ENCOUNTER — Encounter: Payer: Self-pay | Admitting: Certified Nurse Midwife

## 2024-01-25 DIAGNOSIS — Z3482 Encounter for supervision of other normal pregnancy, second trimester: Secondary | ICD-10-CM | POA: Diagnosis not present

## 2024-01-25 DIAGNOSIS — Z3483 Encounter for supervision of other normal pregnancy, third trimester: Secondary | ICD-10-CM | POA: Diagnosis not present

## 2024-01-28 ENCOUNTER — Other Ambulatory Visit: Payer: Self-pay

## 2024-01-28 ENCOUNTER — Other Ambulatory Visit: Payer: Self-pay | Admitting: Obstetrics and Gynecology

## 2024-01-28 ENCOUNTER — Ambulatory Visit: Payer: Self-pay | Attending: Obstetrics and Gynecology

## 2024-01-28 DIAGNOSIS — O358XX0 Maternal care for other (suspected) fetal abnormality and damage, not applicable or unspecified: Secondary | ICD-10-CM

## 2024-01-29 ENCOUNTER — Encounter: Payer: Self-pay | Admitting: Nurse Practitioner

## 2024-02-04 NOTE — Progress Notes (Unsigned)
 Routine Prenatal Care Visit  Subjective  Brandi [name] is a 23 y.o. G1P0 at [redacted]w[redacted]d being seen today for ongoing prenatal care.  She is currently monitored for the following issues for this {Blank single:19197::high-risk,low-risk} pregnancy and has Low TSH level; Vitamin D  deficiency; Supervision of other normal pregnancy, antepartum; Abnormal fetal ultrasound; Sickle cell trait (HCC); Polyhydramnios-MVP 8.7 at 22w; Twin reversed-arterial perfusion (TRAP) sequence pregnancy, antepartum; and Vaginal discharge during pregnancy on their problem list.  ----------------------------------------------------------------------------------- Patient reports {sx:14538}.    .  .   SABRA Limbo Fluid {Actions; denies/reports/admits to:19208}.  ----------------------------------------------------------------------------------- The following portions of the patient's history were reviewed and updated as appropriate: allergies, current medications, past family history, past medical history, past social history, past surgical history and problem list. Problem list updated.  Objective  Last menstrual period 06/26/2023. Pregravid weight 145 lb (65.8 kg) Total Weight Gain 38 lb 14.4 oz (17.6 kg) Urinalysis: Urine Protein    Urine Glucose    Fetal Status:            General:  Alert, oriented and cooperative. Patient is in no acute distress.  Skin: Skin is warm and dry. No rash noted.   Cardiovascular: Normal heart rate noted  Respiratory: Normal respiratory effort, no problems with respiration noted  Abdomen: Soft, gravid, appropriate for gestational age.       Pelvic:  {Blank single:19197::Cervical exam performed,Cervical exam deferred}        Extremities: Normal range of motion.     Mental Status: Normal mood and affect. Normal behavior. Normal judgment and thought content.   Assessment   22 y.o. G1P0 at [redacted]w[redacted]d by  04/01/2024, by Last Menstrual Period presenting for {Blank  single:19197::routine,work-in} prenatal visit  Plan   February 2025 Problems (from 08/28/23 to present)     Problem Noted Diagnosed Resolved   Polyhydramnios-MVP 8.7 at 22w 12/09/2023 by Jayne Harlene CROME, CNM  No   Twin reversed-arterial perfusion (TRAP) sequence pregnancy, antepartum 12/09/2023 by Jayne Harlene CROME, CNM  No   Supervision of other normal pregnancy, antepartum 08/28/2023 by Loralyn Sander, CMA  No   Overview Signed 08/28/2023 11:37 AM by Loralyn Sander, CMA   Clinical Staff Provider  Office Location  [facility] Dating  04/01/2024, by Last Menstrual Period  Language  English Anatomy US     Flu Vaccine  03/31/23 Genetic Screen  NIPS:   TDaP vaccine   Offer Hgb A1C or  GTT Early : Third trimester :   Covid Declined   LAB RESULTS   Rhogam     Blood Type     RSV N/A Antibody    Feeding Plan Breastfeed & Formula Rubella    Contraception No RPR     Circumcision Yes HBsAg     Pediatrician  [facility] HIV    Support Person FOB & Pt's Mom Varicella    Prenatal Classes Yes GBS  (For PCN allergy, check sensitivities)     Hep C     BTL Consent  Pap No results found for: DIAGPAP  VBAC Consent  Hgb Electro      CF      SMA                    {Blank single:19197::Term,Preterm} labor symptoms and general obstetric precautions including but not limited to vaginal bleeding, contractions, leaking of fluid and fetal movement were reviewed in detail with the patient. Please refer to After Visit Summary for other counseling recommendations.  No follow-ups on file.  [place], [place] 07/01/7972 5:54 PM

## 2024-02-04 NOTE — Patient Instructions (Signed)
 Fetal Movement Counts When you're pregnant, you might start feeling your baby move around the middle of your pregnancy. At first, these movements might feel like flutters, rolls, or swishes. As your baby grows, you might feel more kicks and jabs. Around week 28 of your pregnancy, your health care team may ask you to count how often your baby moves. This is important for all pregnancies, but especially for high-risk ones. Counting movements can help lessen the risk of stillbirth. What is a fetal movement count? A fetal movement count is the number of times that you feel your baby move during a certain amount of time. This may also be called a kick count. There are many ways to do a kick count. Ask your team what is best for you. Pay attention to when your baby is most active. You may notice your baby's sleep and wake cycles. You may also notice things that make your baby move more. When you do a kick count, try to do it: When your baby is normally most active. At the same time each day. How do I count fetal movements?  Find a quiet, comfortable area. Sit or lie down. Write down the date, the start time, and the number of movements you feel. Count kicks, flutters, swishes, rolls, and jabs. Usually, you will feel at least 10 movements within 2 hours. Stop counting after you have felt 10 movements or if you have been counting for 2 hours. Write down the stop time. Contact a health care provider if: You don't feel 10 movements in 2 hours. Your baby isn't moving as it usually does. Your baby isn't moving at all. If you're not able to reach your provider, go to an emergency room. This information is not intended to replace advice given to you by your health care provider. Make sure you discuss any questions you have with your health care provider. Document Revised: 07/10/2023 Document Reviewed: 07/02/2022 Elsevier Patient Education  2025 [employer].

## 2024-02-05 ENCOUNTER — Encounter: Payer: Self-pay | Admitting: Advanced Practice Midwife

## 2024-02-05 ENCOUNTER — Ambulatory Visit (INDEPENDENT_AMBULATORY_CARE_PROVIDER_SITE_OTHER): Payer: Self-pay | Admitting: Advanced Practice Midwife

## 2024-02-05 VITALS — BP 111/81 | HR 101 | Wt 190.6 lb

## 2024-02-05 DIAGNOSIS — O358XX0 Maternal care for other (suspected) fetal abnormality and damage, not applicable or unspecified: Secondary | ICD-10-CM

## 2024-02-05 DIAGNOSIS — Z3A32 32 weeks gestation of pregnancy: Secondary | ICD-10-CM | POA: Diagnosis not present

## 2024-02-05 DIAGNOSIS — O0990 Supervision of high risk pregnancy, unspecified, unspecified trimester: Secondary | ICD-10-CM

## 2024-02-05 DIAGNOSIS — O0993 Supervision of high risk pregnancy, unspecified, third trimester: Secondary | ICD-10-CM

## 2024-02-05 MED ORDER — PRENATAL 27-0.8 MG PO TABS: 1.0000 | ORAL_TABLET | Freq: Every day | ORAL | 10 refills | Status: AC

## 2024-02-05 NOTE — Progress Notes (Signed)
 Routine Prenatal Care Visit  Subjective  Brandi [name] is a 23 y.o. G1P0 at [redacted]w[redacted]d being seen today for ongoing prenatal care.  She is currently monitored for the following issues for this high-risk pregnancy and has Low TSH level; Vitamin D  deficiency; Supervision of high risk pregnancy, antepartum; Abnormal fetal ultrasound; Sickle cell trait (HCC); Polyhydramnios-MVP 8.7 at 22w; Twin reversed-arterial perfusion (TRAP) sequence pregnancy, antepartum; and Vaginal discharge during pregnancy on their problem list.  ----------------------------------------------------------------------------------- Patient reports she is doing well. Has regular follow up with MFM with recommended delivery in [place]. Accompanied by her sister today.  Contractions: Not present. Vag. Bleeding: None.  Movement: Present. Leaking Fluid denies.  ----------------------------------------------------------------------------------- The following portions of the patient's history were reviewed and updated as appropriate: allergies, current medications, past family history, past medical history, past social history, past surgical history and problem list. Problem list updated.  Objective  Blood pressure 111/81, pulse (!) 101, weight 190 lb 9.6 oz (86.5 kg), last menstrual period 06/26/2023. Pregravid weight 145 lb (65.8 kg) Total Weight Gain 45 lb 9.6 oz (20.7 kg) Urinalysis: Urine Protein    Urine Glucose    Fetal Status: Fetal Heart Rate (bpm): 147 Fundal Height: 32 cm Movement: Present     General:  Alert, oriented and cooperative. Patient is in no acute distress.  Skin: Skin is warm and dry. No rash noted.   Cardiovascular: Normal heart rate noted  Respiratory: Normal respiratory effort, no problems with respiration noted  Abdomen: Soft, gravid, appropriate for gestational age. Pain/Pressure: Absent     Pelvic:  Cervical exam deferred        Extremities: Normal range of motion.  Edema: None  Mental Status:  Normal mood and affect. Normal behavior. Normal judgment and thought content.   Assessment   22 y.o. G1P0 at [redacted]w[redacted]d by  04/01/2024, by Last Menstrual Period presenting for routine prenatal visit  Plan   February 2025 Problems (from 08/28/23 to present)     Problem Noted Diagnosed Resolved   Polyhydramnios-MVP 8.7 at 22w 12/09/2023 by Jayne Harlene CROME, CNM  No   Twin reversed-arterial perfusion (TRAP) sequence pregnancy, antepartum 12/09/2023 by Jayne Harlene CROME, CNM  No   Supervision of high risk pregnancy, antepartum 08/28/2023 by Loralyn Sander, CMA  No   Overview Addendum 02/05/2024  3:50 PM by Lynda Bradley, CNM   Clinical Staff Provider  Office Location  [facility] Dating  04/01/2024, by Last Menstrual Period  Language  English Anatomy US   Acardiac twin  Flu Vaccine  03/31/23 Genetic Screen  NIPS:   TDaP vaccine   Offer Hgb A1C or  GTT Early : Third trimester :   Covid Declined   LAB RESULTS   Rhogam  B/Positive/-- (03/17 1409)  Blood Type B/Positive/-- (03/17 1409)   RSV N/A Antibody Negative (03/17 1409)  Feeding Plan Breastfeed & Formula Rubella 2.09 (03/17 1409)  Contraception No RPR Non Reactive (07/08 0954)   Circumcision Yes HBsAg Negative (03/17 1409)   Pediatrician  [facility] HIV Non Reactive (07/08 0954)  Support Person FOB & Pt's Mom Varicella Reactive (03/17 1409)  Prenatal Classes Yes GBS  (For PCN allergy, check sensitivities)     Hep C Non Reactive (03/17 1409)   BTL Consent  Pap No results found for: DIAGPAP  VBAC Consent NA Hgb Electro      CF      SMA  Preterm labor symptoms and general obstetric precautions including but not limited to vaginal bleeding, contractions, leaking of fluid and fetal movement were reviewed in detail with the patient. Please refer to After Visit Summary for other counseling recommendations.   Return in about 2 weeks (around 02/19/2024) for rob.  Slater Rains, CNM 02/05/2024 4:00 PM

## 2024-02-10 ENCOUNTER — Observation Stay

## 2024-02-10 ENCOUNTER — Observation Stay
Admission: EM | Admit: 2024-02-10 | Discharge: 2024-02-10 | Disposition: A | Discharge status: Home / Self Care | Attending: Obstetrics | Admitting: Obstetrics

## 2024-02-10 ENCOUNTER — Encounter: Payer: Self-pay | Admitting: Obstetrics

## 2024-02-10 DIAGNOSIS — M549 Dorsalgia, unspecified: Secondary | ICD-10-CM | POA: Diagnosis not present

## 2024-02-10 DIAGNOSIS — O26893 Other specified pregnancy related conditions, third trimester: Secondary | ICD-10-CM | POA: Diagnosis not present

## 2024-02-10 DIAGNOSIS — R109 Unspecified abdominal pain: Secondary | ICD-10-CM | POA: Diagnosis not present

## 2024-02-10 DIAGNOSIS — O99413 Diseases of the circulatory system complicating pregnancy, third trimester: Secondary | ICD-10-CM | POA: Diagnosis not present

## 2024-02-10 DIAGNOSIS — O4703 False labor before 37 completed weeks of gestation, third trimester: Secondary | ICD-10-CM | POA: Diagnosis present

## 2024-02-10 DIAGNOSIS — Z3A35 35 weeks gestation of pregnancy: Secondary | ICD-10-CM | POA: Diagnosis not present

## 2024-02-10 DIAGNOSIS — O358XX0 Maternal care for other (suspected) fetal abnormality and damage, not applicable or unspecified: Secondary | ICD-10-CM

## 2024-02-10 DIAGNOSIS — I4940 Unspecified premature depolarization: Secondary | ICD-10-CM | POA: Diagnosis not present

## 2024-02-10 DIAGNOSIS — Z3A32 32 weeks gestation of pregnancy: Secondary | ICD-10-CM | POA: Diagnosis not present

## 2024-02-10 DIAGNOSIS — O0990 Supervision of high risk pregnancy, unspecified, unspecified trimester: Principal | ICD-10-CM

## 2024-02-10 DIAGNOSIS — O402XX1 Polyhydramnios, second trimester, fetus 1: Secondary | ICD-10-CM

## 2024-02-10 LAB — URINALYSIS, COMPLETE (UACMP) WITH MICROSCOPIC
Bilirubin Urine: NEGATIVE
Glucose, UA: NEGATIVE mg/dL
Hgb urine dipstick: NEGATIVE
Ketones, ur: NEGATIVE mg/dL
Nitrite: NEGATIVE
Protein, ur: NEGATIVE mg/dL
Specific Gravity, Urine: 1.014 (ref 1.005–1.030)
pH: 5 (ref 5.0–8.0)

## 2024-02-10 LAB — WET PREP, GENITAL
Clue Cells Wet Prep HPF POC: NONE SEEN
Sperm: NONE SEEN
Trich, Wet Prep: NONE SEEN
WBC, Wet Prep HPF POC: >=10 — AB (ref <10)
Yeast Wet Prep HPF POC: NONE SEEN

## 2024-02-10 LAB — URINE DRUG SCREEN, QUALITATIVE (ARMC ONLY)
Amphetamines, Ur Screen: NOT DETECTED
Barbiturates, Ur Screen: NOT DETECTED
Benzodiazepine, Ur Scrn: NOT DETECTED
Cannabinoid 50 Ng, Ur ~~LOC~~: NOT DETECTED
Cocaine Metabolite,Ur ~~LOC~~: NOT DETECTED
MDMA (Ecstasy)Ur Screen: NOT DETECTED
Methadone Scn, Ur: NOT DETECTED
Opiate, Ur Screen: NOT DETECTED
Phencyclidine (PCP) Ur S: NOT DETECTED
Tricyclic, Ur Screen: NOT DETECTED

## 2024-02-10 LAB — CBC
HCT: 31.4 % — ABNORMAL LOW (ref 36.0–46.0)
Hemoglobin: 10.7 g/dL — ABNORMAL LOW (ref 12.0–15.0)
MCH: 27.7 pg (ref 26.0–34.0)
MCHC: 34.1 g/dL (ref 30.0–36.0)
MCV: 81.3 fL (ref 80.0–100.0)
Platelets: 198 K/uL (ref 150–400)
RBC: 3.86 MIL/uL — ABNORMAL LOW (ref 3.87–5.11)
RDW: 12.8 % (ref 11.5–15.5)
WBC: 8.2 K/uL (ref 4.0–10.5)
nRBC: 0 % (ref 0.0–0.2)

## 2024-02-10 LAB — CHLAMYDIA/NGC RT PCR (ARMC ONLY)
Chlamydia Tr: NOT DETECTED
N gonorrhoeae: NOT DETECTED

## 2024-02-10 LAB — GROUP B STREP BY PCR: Group B strep by PCR: NEGATIVE

## 2024-02-10 LAB — FETAL FIBRONECTIN: Fetal Fibronectin: POSITIVE — AB

## 2024-02-10 MED ORDER — NIFEDIPINE ER OSMOTIC RELEASE 30 MG PO TB24
30.0000 mg | ORAL_TABLET | Freq: Every day | ORAL | Status: DC
  Administered 2024-02-10 (×2): 30 mg via ORAL
  Filled 2024-02-10: qty 1

## 2024-02-10 MED ORDER — SOD CITRATE-CITRIC ACID 500-334 MG/5ML PO SOLN: 30.0000 mL | ORAL | Status: DC | PRN

## 2024-02-10 MED ORDER — ONDANSETRON HCL 4 MG/2ML IJ SOLN: 4.0000 mg | Freq: Four times a day (QID) | INTRAMUSCULAR | Status: DC | PRN

## 2024-02-10 MED ORDER — LACTATED RINGERS IV SOLN: 500.0000 mL | INTRAVENOUS | Status: DC | PRN

## 2024-02-10 MED ORDER — ACETAMINOPHEN 325 MG PO TABS: 650.0000 mg | ORAL_TABLET | ORAL | Status: DC | PRN

## 2024-02-10 NOTE — OB Triage Note (Signed)
 Pt Brandi [name] 22 y.o. presents to labor and delivery triage reporting Tuesday around 12pm started having achey back pain that progressed to abdomnial pain that just felt tight Pt unsure if they are contractions or not . Pt is a G1P0 at [redacted]w[redacted]d . Pt denies signs and symptons consistent with rupture of membranes or active vaginal bleeding. Pt  states positive fetal movement. External FM and TOCO applied to non-tender abdomen and assessing. Initial FHR . Vital signs obtained and within normal limits. Provider notified of pt.

## 2024-02-10 NOTE — Progress Notes (Signed)
 Patient feeling contractions as mild and generally calming down. Feels them mainly as tight.  BP 130/75 (BP Location: Right Arm)   Pulse 81   Temp 97.9 F (36.6 C) (Oral)   Resp 14   LMP 06/26/2023 (Exact Date)   Fetal well being: 130 bpm, moderate variability, +accelerations, -decelerations Toco: every 10-12 minutes lasting 30-40 seconds   Latest Reference Range & Units 02/10/24 07:41 02/10/24 07:47 02/10/24 07:52 02/10/24 09:08  WBC 4.0 - 10.5 K/uL 8.2     RBC 3.87 - 5.11 MIL/uL 3.86 (L)     Hemoglobin 12.0 - 15.0 g/dL 89.2 (L)     HCT 63.9 - 46.0 % 31.4 (L)     MCV 80.0 - 100.0 fL 81.3     MCH 26.0 - 34.0 pg 27.7     MCHC 30.0 - 36.0 g/dL 65.8     RDW 88.4 - 84.4 % 12.8     Platelets 150 - 400 K/uL 198     nRBC 0.0 - 0.2 % 0.0     Fetal Fibronectin NEGATIVE    POSITIVE !   Specimen source GC/Chlam   ENDOCERVICAL    Chlamydia Tr NOT DETECTED   NOT DETECTED    Group B strep by PCR PRESUMPTIVE NEGATIVE   PRESUMPTIVE NEGATIVE    N gonorrhoeae NOT DETECTED   NOT DETECTED    Yeast Wet Prep HPF POC NONE SEEN   NONE SEEN    Trich, Wet Prep NONE SEEN   NONE SEEN    Clue Cells Wet Prep HPF POC NONE SEEN   NONE SEEN    WBC, Wet Prep HPF POC <10   >=10 !    Appearance CLEAR   HAZY !    Bilirubin Urine NEGATIVE   NEGATIVE    Color, Urine YELLOW   YELLOW !    Glucose, UA NEGATIVE mg/dL  NEGATIVE    Hgb urine dipstick NEGATIVE   NEGATIVE    Ketones, ur NEGATIVE mg/dL  NEGATIVE    Leukocytes,Ua NEGATIVE   LARGE !    Nitrite NEGATIVE   NEGATIVE    pH 5.0 - 8.0   5.0    Protein NEGATIVE mg/dL  NEGATIVE    Specific Gravity, Urine 1.005 - 1.030   1.014    Bacteria, UA NONE SEEN   MANY !    Mucus   PRESENT    RBC / HPF 0 - 5 RBC/hpf  0-5    Squamous Epithelial / HPF 0 - 5 /HPF  0-5    WBC, UA 0 - 5 WBC/hpf  11-20    Amphetamines, Ur Screen NONE DETECTED   NONE DETECTED    Barbiturates, Ur Screen NONE DETECTED   NONE DETECTED    Benzodiazepine, Ur Scrn NONE DETECTED   NONE DETECTED     Cocaine Metabolite,Ur [place] NONE DETECTED   NONE DETECTED    Methadone Scn, Ur NONE DETECTED   NONE DETECTED    MDMA (Ecstasy)Ur Screen NONE DETECTED   NONE DETECTED    Cannabinoid 50 Ng, Ur [place] NONE DETECTED   NONE DETECTED    Opiate, Ur Screen NONE DETECTED   NONE DETECTED    Phencyclidine (PCP) Ur S NONE DETECTED   NONE DETECTED    Tricyclic, Ur Screen NONE DETECTED   NONE DETECTED    Wet prep, genital   Rpt !    US  OB Limited     Rpt  (L): Data is abnormally low !: Data is abnormal Rpt: View  report in Results Review for more information   Add on Urine Culture to UA order FFN Positive: reviewed this finding and possible outcomes with patient  Reviewed plan per Dr Janit with patient- continue observation until later in the day and if contractions continue to decrease and are mild, can discharge to home with Rx procardia  for prevention of preterm contractions.   Brandi [name], CNM [facility] 02/10/2024 11:21 AM

## 2024-02-10 NOTE — Discharge Summary (Signed)
 Physician Final Progress Note  Patient ID: Brandi [name] MRN: [id number] DOB/AGE: 07-23-2000 22 y.o.  Admit date: 02/10/2024 Admitting provider: Eleanor CHRISTELLA Canny, CNM Discharge date: 02/10/2024   Admission Diagnoses:  1) intrauterine pregnancy at [redacted]w[redacted]d  2) contractions  Discharge Diagnoses:  Principal Problem:   Threatened preterm labor, third trimester Active Problems:   Labor and delivery, indication for care   [redacted] weeks gestation of pregnancy    History of Present Illness: The patient is a 23 y.o. female G1P0 at [redacted]w[redacted]d who presents early this morning with back ache and abdominal pain, uncertain if she was having contractions. States good fetal movement, denies vaginal bleeding or leakage of fluid. Admitted for observation and placed on monitors. IV fluid bolus given. Labs done, procardia  administered. By time of discharge she is having rare contractions. Reports feeling better. Discharged with instructions and precautions.  Has MFM follow up appointment tomorrow.  Her pregnancy is complicated by an acardiac twin and polyhydramnios. The surviving twin has no evidence of hydrops or cardiac anomalies per her most recent US  and fetal echo.   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   Current Outpatient Medications on File Prior to Encounter  Medication Sig Dispense Refill   Prenatal Vit-Fe Fumarate-FA (MULTIVITAMIN-PRENATAL) 27-0.8 MG TABS tablet Take 1 tablet by mouth daily at 12 noon. 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)  Tobacco Use   Smoking status: Never   Smokeless tobacco: Never  Vaping Use   Vaping status: Former  Substance and Sexual Activity   Alcohol use: Not Currently   Drug use: Not Currently    Types: Marijuana    Comment: last used November 2024   Sexual activity: Not Currently    Partners: Male    Birth control/protection: None  Other Topics Concern   Not on file  Social History Narrative   Not on file   Social Drivers of Health   Financial Resource Strain: Low Risk  (08/28/2023)   Overall Financial Resource Strain (CARDIA)    Difficulty of Paying Living Expenses: Not very hard  Food Insecurity: No Food Insecurity (08/28/2023)   Hunger Vital Sign    Worried About Running Out of Food in the Last Year: Never true    Ran Out of Food in the Last Year: Never true  Transportation Needs: No Transportation Needs (08/28/2023)   PRAPARE - Transportation    Lack of Transportation (Medical): No    Lack of Transportation (Non-Medical): No  Physical Activity: Insufficiently Active (08/28/2023)   Exercise Vital Sign    Days of Exercise per Week: 7 days    Minutes of Exercise per Session: 10 min  Stress: No Stress Concern Present (08/28/2023)   [employer] of Occupational Health - Occupational Stress Questionnaire  Feeling of Stress : Not at all  Social Connections: Moderately Isolated (08/28/2023)   Social Connection and Isolation Panel    Frequency of Communication with Friends and Family: More than three times a week    Frequency of Social Gatherings with Friends and Family: More than three times a week    Attends Religious Services: 1 to 4 times per year    Active Member of [employer] or Organizations: No    Attends Banker Meetings: Never    Marital Status: Never married  Intimate Partner Violence: Not At Risk (08/28/2023)    Humiliation, Afraid, Rape, and Kick questionnaire    Fear of Current or Ex-Partner: No    Emotionally Abused: No    Physically Abused: No    Sexually Abused: No    Family History  Problem Relation Age of Onset   Asthma Neg Hx    Cancer Neg Hx    Diabetes Neg Hx    Heart disease Neg Hx    Hypertension Neg Hx      Review of Systems  Constitutional:  Negative for chills and fever.  HENT:  Negative for congestion, ear discharge, ear pain, hearing loss, sinus pain and sore throat.   Eyes:  Negative for blurred vision and double vision.  Respiratory:  Negative for cough, shortness of breath and wheezing.   Cardiovascular:  Negative for chest pain, palpitations and leg swelling.  Gastrointestinal:  Positive for abdominal pain. Negative for blood in stool, constipation, diarrhea, heartburn, melena, nausea and vomiting.  Genitourinary:  Negative for dysuria, flank pain, frequency, hematuria and urgency.  Musculoskeletal:  Positive for back pain. Negative for joint pain and myalgias.  Skin:  Negative for itching and rash.  Neurological:  Negative for dizziness, tingling, tremors, sensory change, speech change, focal weakness, seizures, loss of consciousness, weakness and headaches.  Endo/Heme/Allergies:  Negative for environmental allergies. Does not bruise/bleed easily.  Psychiatric/Behavioral:  Negative for depression, hallucinations, memory loss, substance abuse and suicidal ideas. The patient is not nervous/anxious and does not have insomnia.      Physical Exam: BP 117/74 (BP Location: Left Arm)   Pulse 93   Temp 98.4 F (36.9 C) (Oral)   Resp 14   LMP 06/26/2023 (Exact Date)   Constitutional: Well nourished, well developed female in no acute distress.  HEENT: normal Skin: Warm and dry.  Cardiovascular: Regular rate and rhythm.   Extremity: no edema Respiratory:  Normal respiratory effort Abdomen: FHT present Back: no CVAT Psych: Alert and Oriented x3. No memory deficits.  Normal mood and affect.     Fetal well being: 135 bpm, moderate variability, +accelerations, -decelerations Toco: rare  Consults: none  Significant Findings/ Diagnostic Studies: labs and imaging Narrative & Impression  CLINICAL DATA:  Pre term contractions.   EXAM: LIMITED OBSTETRIC ULTRASOUND   COMPARISON:  01/13/2024   FINDINGS: Number of Fetuses: 1   Heart Rate:  140 bpm   Movement: Yes   Presentation: Cephalic   Placental Location: Anterior   Previa: No   Amniotic Fluid (Subjective):  Within normal limits.   AFI: 17.4 cm   BPD: 8.9 cm 35 w  6 d   MATERNAL FINDINGS:   Cervix: Appears closed. 3.8 cm length measured transabdominal. Adnexal   Uterus/Adnexae: No abnormality visualized.   IMPRESSION: Single living intrauterine gestation in cephalic presentation at estimated 35 week 6 day gestational age by BPD.   This exam is performed on an emergent basis and does not comprehensively evaluate fetal size,  dating, or anatomy; follow-up complete OB US  should be considered if further fetal assessment is warranted.     Electronically Signed   By: Camellia Candle M.D.   On: 02/10/2024 09:57      Latest Reference Range & Units 02/10/24 07:41 02/10/24 07:47  WBC 4.0 - 10.5 K/uL 8.2   RBC 3.87 - 5.11 MIL/uL 3.86 (L)   Hemoglobin 12.0 - 15.0 g/dL 89.2 (L)   HCT 63.9 - 53.9 % 31.4 (L)   MCV 80.0 - 100.0 fL 81.3   MCH 26.0 - 34.0 pg 27.7   MCHC 30.0 - 36.0 g/dL 65.8   RDW 88.4 - 84.4 % 12.8   Platelets 150 - 400 K/uL 198   nRBC 0.0 - 0.2 % 0.0   Specimen source GC/Chlam   ENDOCERVICAL  Chlamydia Tr NOT DETECTED   NOT DETECTED  Group B strep by PCR PRESUMPTIVE NEGATIVE   PRESUMPTIVE NEGATIVE  N gonorrhoeae NOT DETECTED   NOT DETECTED  Yeast Wet Prep HPF POC NONE SEEN   NONE SEEN  Trich, Wet Prep NONE SEEN   NONE SEEN  Clue Cells Wet Prep HPF POC NONE SEEN   NONE SEEN  WBC, Wet Prep HPF POC <10   >=10 !  Appearance CLEAR   HAZY !  Bilirubin Urine NEGATIVE    NEGATIVE  Color, Urine YELLOW   YELLOW !  Glucose, UA NEGATIVE mg/dL  NEGATIVE  Hgb urine dipstick NEGATIVE   NEGATIVE  Ketones, ur NEGATIVE mg/dL  NEGATIVE  Leukocytes,Ua NEGATIVE   LARGE !  Nitrite NEGATIVE   NEGATIVE  pH 5.0 - 8.0   5.0  Protein NEGATIVE mg/dL  NEGATIVE  Specific Gravity, Urine 1.005 - 1.030   1.014  Bacteria, UA NONE SEEN   MANY !  Mucus   PRESENT  RBC / HPF 0 - 5 RBC/hpf  0-5  Squamous Epithelial / HPF 0 - 5 /HPF  0-5  WBC, UA 0 - 5 WBC/hpf  11-20  Amphetamines, Ur Screen NONE DETECTED   NONE DETECTED  Barbiturates, Ur Screen NONE DETECTED   NONE DETECTED  Benzodiazepine, Ur Scrn NONE DETECTED   NONE DETECTED  Cocaine Metabolite,Ur [place] NONE DETECTED   NONE DETECTED  Methadone Scn, Ur NONE DETECTED   NONE DETECTED  MDMA (Ecstasy)Ur Screen NONE DETECTED   NONE DETECTED  Cannabinoid 50 Ng, Ur  NONE DETECTED   NONE DETECTED  Opiate, Ur Screen NONE DETECTED   NONE DETECTED  Phencyclidine (PCP) Ur S NONE DETECTED   NONE DETECTED  Tricyclic, Ur Screen NONE DETECTED   NONE DETECTED  Urine Culture  Rpt (IP)   Wet prep, genital   Rpt !  (L): Data is abnormally low !: Data is abnormal (IP): In Process Rpt: View report in Results Review for more information   Latest Reference Range & Units 02/10/24 07:52  Fetal Fibronectin NEGATIVE  POSITIVE !  !: Data is abnormal   Procedures: NST  Hospital Course: The patient was admitted to Labor and Delivery Triage for observation.   Discharge Condition: good  Disposition: Discharge disposition: 01-Home or Self Care  Diet: Regular diet, stay well hydrated  Discharge Activity: Activity as tolerated  Discharge Instructions     Discharge activity:   Complete by: As directed    Avoid strenuous activity for the next couple of days. Activity as tolerated.   Discharge diet:  No restrictions   Complete by: As directed       Allergies as of 02/10/2024  Reactions   Plum Pulp Hives   Oral reaction    Apple  Juice    Patient states she is allergic to any apple products   Pork Allergy         Medication List     TAKE these medications    multivitamin-prenatal 27-0.8 MG Tabs tablet Take 1 tablet by mouth daily at 12 noon.        Follow-up Information     [facility]. Go to.   Specialty: Obstetrics and Gynecology Why: scheduled prenatal appointment Contact information: [address] [place]  [id number] [phone]                Total time spent taking care of this patient: 35 minutes  Signed: Slater Rains, CNM  02/10/2024, 4:46 PM

## 2024-02-10 NOTE — Progress Notes (Signed)
 Discharge instructions provided to patient. Patient verbalized understanding. Pt educated on signs and symptoms of labor, vaginal bleeding, LOF, and fetal movement. Red flag signs reviewed by RN. Patient discharged home in stable condition.

## 2024-02-10 NOTE — OB Triage Note (Signed)
 LABOR & DELIVERY OB TRIAGE NOTE  SUBJECTIVE  HPI Brandi [name] is a 23 y.o. G1P0 at [redacted]w[redacted]d who presents to Labor & Delivery for contractions. She reports that she has been feeling some back pain and abdominal tightening. She denies LOF and vaginal bleeding. Her pregnancy is complicated by an acardiac twin and polyhydramnios. The surviving twin has no evidence of hydrops or cardiac anomalies per her most recent US  and fetal echo.  OB History     Gravida  1   Para      Term      Preterm      AB      Living         SAB      IAB      Ectopic      Multiple      Live Births              Scheduled Meds:  NIFEdipine   30 mg Oral Daily   Continuous Infusions:  lactated ringers      PRN Meds:.acetaminophen , lactated ringers , ondansetron , sodium citrate-citric acid   OBJECTIVE  LMP 06/26/2023 (Exact Date)   General: alert, cooperative, NAD Abdomen: soft, gravid, non-tender SSE: cervix appears slightly open; moderate amount of creamy white discharge Cervical exam:  external os FT, internal os closed/ 40% effaced/-2  NST I reviewed the NST and it was reactive.  Baseline: 140 Variability: moderate Accelerations: present Decelerations:none Toco: q 2-5 min, mild Category 1  ASSESSMENT Impression  1) Pregnancy at G1P0, [redacted]w[redacted]d, Estimated Date of Delivery: 04/01/24 2) Preterm contractions  PLAN 1) Consulted with Dr. Leigh.  -IV hydration and nifedipine  30 mg -fFN, GBS, wet prep, GC/CT, UA, UDS, CBC collected -US  ordered for cervical length, presentation, and AFI -Consider transfer to [facility] if labor progresses  Eleanor Canny, CNM 02/10/24  7:30 AM

## 2024-02-11 ENCOUNTER — Ambulatory Visit (HOSPITAL_BASED_OUTPATIENT_CLINIC_OR_DEPARTMENT_OTHER): Payer: Self-pay

## 2024-02-11 ENCOUNTER — Other Ambulatory Visit: Payer: Self-pay | Admitting: *Deleted

## 2024-02-11 ENCOUNTER — Ambulatory Visit: Payer: Self-pay

## 2024-02-11 ENCOUNTER — Ambulatory Visit: Attending: Obstetrics and Gynecology | Admitting: Maternal & Fetal Medicine

## 2024-02-11 VITALS — BP 106/70 | HR 102 | Ht 63.0 in | Wt 131.2 lb

## 2024-02-11 DIAGNOSIS — Z148 Genetic carrier of other disease: Secondary | ICD-10-CM | POA: Diagnosis not present

## 2024-02-11 DIAGNOSIS — O403XX1 Polyhydramnios, third trimester, fetus 1: Secondary | ICD-10-CM | POA: Diagnosis not present

## 2024-02-11 DIAGNOSIS — O30033 Twin pregnancy, monochorionic/diamniotic, third trimester: Secondary | ICD-10-CM | POA: Diagnosis not present

## 2024-02-11 DIAGNOSIS — O402XX0 Polyhydramnios, second trimester, not applicable or unspecified: Secondary | ICD-10-CM

## 2024-02-11 DIAGNOSIS — Z3A32 32 weeks gestation of pregnancy: Secondary | ICD-10-CM

## 2024-02-11 DIAGNOSIS — O403XX0 Polyhydramnios, third trimester, not applicable or unspecified: Secondary | ICD-10-CM | POA: Diagnosis not present

## 2024-02-11 DIAGNOSIS — O358XX0 Maternal care for other (suspected) fetal abnormality and damage, not applicable or unspecified: Secondary | ICD-10-CM

## 2024-02-11 DIAGNOSIS — O402XX1 Polyhydramnios, second trimester, fetus 1: Secondary | ICD-10-CM

## 2024-02-11 DIAGNOSIS — O4703 False labor before 37 completed weeks of gestation, third trimester: Secondary | ICD-10-CM

## 2024-02-11 DIAGNOSIS — O0990 Supervision of high risk pregnancy, unspecified, unspecified trimester: Secondary | ICD-10-CM

## 2024-02-11 DIAGNOSIS — Z362 Encounter for other antenatal screening follow-up: Secondary | ICD-10-CM | POA: Insufficient documentation

## 2024-02-11 LAB — URINE CULTURE: Culture: NO GROWTH

## 2024-02-11 NOTE — Progress Notes (Signed)
 Patient information  Patient Name: Brandi [name] [facility]  Patient MRN:   [id number]  Referring practice: MFM Referring Provider: Belle SHIPPER  Problem List   Patient Active Problem List   Diagnosis Date Noted   Labor and delivery, indication for care 02/10/2024   Threatened preterm labor, third trimester 02/10/2024   [redacted] weeks gestation of pregnancy 02/10/2024   Vaginal discharge during pregnancy 01/04/2024   Polyhydramnios-MVP 8.7 at 22w 12/09/2023   Twin reversed-arterial perfusion (TRAP) sequence pregnancy, antepartum (RESOLVED) 12/09/2023   Sickle cell trait (HCC) 10/12/2023   Supervision of high risk pregnancy, antepartum 08/28/2023   Vitamin D  deficiency 01/21/2023   Low TSH level 06/06/2022   Maternal Fetal medicine Consult  Brandi [name] is a 23 y.o. G1P0 at [redacted]w[redacted]d here for ultrasound and consultation. Brandi [name] is doing well today with no acute concerns. Today we focused on the following:   Demise of acardiac twin: The patient and I have previously discussed that there is no data regarding how to manage this type of pregnancy.  Given the size of the acardiac twin is less than 10 g and almost completely reabsorbed, there is physiologically almost no possibility for TRAP sequence.  The patient had a fetal echo and that was also normal.  At this point out of an abundance of caution and little clinical data on how to manage this type of pregnancy, we will continue to do antenatal testing.  This can be done at the [facility] providers office.  We will continue to look at the growth every 4 to 6 weeks.  Delivery timing can be around 39 weeks due to mild poly.   The patient had time to ask questions that were answered to her satisfaction.  She verbalized understanding and agrees to proceed with the plan below.  Sonographic findings Single intrauterine pregnancy. Fetal cardiac activity: Observed. Presentation: Cephalic. Interval fetal anatomy appears normal.  The  acardiac twin is now coalesced into a small collection of tissue on the placental surface.  This area weighs approximately 7.5 g which is very small. Fetal biometry shows the estimated fetal weight at the 83 percentile. Amniotic fluid: Polyhydramnios.  MVP: 10.36 cm. Placenta: Anterior. BPP: 8/8.   There are limitations of prenatal ultrasound such as the inability to detect certain abnormalities due to poor visualization. Various factors such as fetal position, gestational age and maternal body habitus may increase the difficulty in visualizing the fetal anatomy.    Recommendations -Weekly antenatal testing until delivery around 39 weeks. The patient was concerned about the delivery timing because Dr. Arna previously recommended a 37-week delivery.  I discussed this with the patient stating that the TRAP sequence and twin pregnancy concerns are no longer applicable since the acardiac twin is essentially resorbed.  I discussed with Dr. Donalda and he agrees with a 39-week delivery.  Review of Systems: A review of systems was performed and was negative except per HPI   Vitals and Physical Exam    02/11/2024    8:23 AM 02/10/2024    3:48 PM 02/10/2024   11:45 AM  Vitals with BMI  Systolic 106 117 882  Diastolic 70 74 75  Pulse 102 93 92   Sitting comfortably on the sonogram table Nonlabored breathing Normal rate and rhythm Abdomen is nontender  Past pregnancies OB History  Gravida Para Term Preterm AB Living  1       SAB IAB Ectopic Multiple Live Births          #  Outcome Date GA Lbr Len/2nd Weight Sex Type Anes PTL Lv  1 Current              I spent 20 minutes reviewing the patients chart, including labs and images as well as counseling the patient about her medical conditions. Greater than 50% of the time was spent in direct face-to-face patient counseling.  Delora Smaller  MFM, [facility]   02/12/2024  8:45 AM

## 2024-02-17 ENCOUNTER — Other Ambulatory Visit

## 2024-02-17 ENCOUNTER — Ambulatory Visit: Attending: Maternal & Fetal Medicine

## 2024-02-17 DIAGNOSIS — O402XX1 Polyhydramnios, second trimester, fetus 1: Secondary | ICD-10-CM

## 2024-02-17 DIAGNOSIS — O358XX0 Maternal care for other (suspected) fetal abnormality and damage, not applicable or unspecified: Secondary | ICD-10-CM

## 2024-02-17 DIAGNOSIS — O4703 False labor before 37 completed weeks of gestation, third trimester: Secondary | ICD-10-CM

## 2024-02-17 DIAGNOSIS — O0990 Supervision of high risk pregnancy, unspecified, unspecified trimester: Secondary | ICD-10-CM

## 2024-02-19 ENCOUNTER — Other Ambulatory Visit (HOSPITAL_COMMUNITY)
Admission: RE | Admit: 2024-02-19 | Discharge: 2024-02-19 | Disposition: A | Discharge status: Home / Self Care | Source: Ambulatory Visit | Attending: Advanced Practice Midwife | Admitting: Advanced Practice Midwife

## 2024-02-19 ENCOUNTER — Ambulatory Visit (INDEPENDENT_AMBULATORY_CARE_PROVIDER_SITE_OTHER): Admitting: Advanced Practice Midwife

## 2024-02-19 VITALS — BP 98/70 | HR 98 | Wt 187.1 lb

## 2024-02-19 DIAGNOSIS — N898 Other specified noninflammatory disorders of vagina: Secondary | ICD-10-CM

## 2024-02-19 DIAGNOSIS — Z3A34 34 weeks gestation of pregnancy: Secondary | ICD-10-CM | POA: Diagnosis not present

## 2024-02-19 DIAGNOSIS — O0990 Supervision of high risk pregnancy, unspecified, unspecified trimester: Secondary | ICD-10-CM | POA: Diagnosis not present

## 2024-02-19 DIAGNOSIS — O402XX1 Polyhydramnios, second trimester, fetus 1: Secondary | ICD-10-CM | POA: Diagnosis not present

## 2024-02-19 NOTE — Progress Notes (Signed)
 Routine Prenatal Care Visit  Subjective  Brandi [name] is a 23 y.o. G1P0 at [redacted]w[redacted]d being seen today for ongoing prenatal care.  She is currently monitored for the following issues for this high-risk pregnancy and has Low TSH level; Vitamin D  deficiency; Supervision of high risk pregnancy, antepartum; Sickle cell trait (HCC); Polyhydramnios-MVP 8.7 at 22w; Twin reversed-arterial perfusion (TRAP) sequence pregnancy, antepartum (RESOLVED); Vaginal discharge during pregnancy; Labor and delivery, indication for care; and Threatened preterm labor, third trimester on their problem list.  ----------------------------------------------------------------------------------- Patient reports pressure feeling in her vagina and some mild cramping. No labor type contractions. Weekly NST per MFM.  She has thick white discharge since recent hospital visit. No itching or irritation. Aptima swab today and can use OTC monistat. Contractions: Irritability. Vag. Bleeding: None.  Movement: Present. Leaking Fluid denies.  ----------------------------------------------------------------------------------- The following portions of the patient's history were reviewed and updated as appropriate: allergies, current medications, past family history, past medical history, past social history, past surgical history and problem list. Problem list updated.  Objective  Blood pressure 98/70, pulse 98, weight 187 lb 1.6 oz (84.9 kg), last menstrual period 06/26/2023. Pregravid weight 145 lb (65.8 kg) Total Weight Gain 42 lb 1.6 oz (19.1 kg) Urinalysis: Urine Protein    Urine Glucose    Fetal Status: Fetal Heart Rate (bpm): 137 Fundal Height: 36 cm Movement: Present     General:  Alert, oriented and cooperative. Patient is in no acute distress.  Skin: Skin is warm and dry. No rash noted.   Cardiovascular: Normal heart rate noted  Respiratory: Normal respiratory effort, no problems with respiration noted  Abdomen: Soft,  gravid, appropriate for gestational age. Pain/Pressure: Present     Pelvic:  Self swab aptima        Extremities: Normal range of motion.  Edema: None  Mental Status: Normal mood and affect. Normal behavior. Normal judgment and thought content.   Assessment   23 y.o. G1P0 at [redacted]w[redacted]d by  04/01/2024, by Last Menstrual Period presenting for routine prenatal visit  Plan   February 2025 Problems (from 08/28/23 to present)     Problem Noted Diagnosed Resolved   Threatened preterm labor, third trimester 02/10/2024 by Lynda Bradley, CNM  No   Polyhydramnios-MVP 8.7 at 22w 12/09/2023 by Jayne Harlene CROME, CNM  No   Twin reversed-arterial perfusion (TRAP) sequence pregnancy, antepartum (RESOLVED) 12/09/2023 by Jayne Harlene CROME, CNM  No   Supervision of high risk pregnancy, antepartum 08/28/2023 by Loralyn Sander, CMA  No   Overview Addendum 02/05/2024  3:50 PM by Lynda Bradley, CNM   Clinical Staff Provider  Office Location  [facility] Dating  04/01/2024, by Last Menstrual Period  Language  English Anatomy US   Acardiac twin  Flu Vaccine  03/31/23 Genetic Screen  NIPS:   TDaP vaccine   Offer Hgb A1C or  GTT Early : Third trimester :   Covid Declined   LAB RESULTS   Rhogam  B/Positive/-- (03/17 1409)  Blood Type B/Positive/-- (03/17 1409)   RSV N/A Antibody Negative (03/17 1409)  Feeding Plan Breastfeed & Formula Rubella 2.09 (03/17 1409)  Contraception No RPR Non Reactive (07/08 0954)   Circumcision Yes HBsAg Negative (03/17 1409)   Pediatrician  [facility] HIV Non Reactive (07/08 0954)  Support Person FOB & Pt's Mom Varicella Reactive (03/17 1409)  Prenatal Classes Yes GBS  (For PCN allergy, check sensitivities)     Hep C Non Reactive (03/17 1409)   BTL Consent  Pap No  results found for: DIAGPAP  VBAC Consent NA Hgb Electro      CF      SMA                    Preterm labor symptoms and general obstetric precautions including but not limited to vaginal bleeding,  contractions, leaking of fluid and fetal movement were reviewed in detail with the patient. Please refer to After Visit Summary for other counseling recommendations.   Return in about 1 week (around 02/26/2024) for nst/rob.  Slater Rains, CNM 02/19/2024 11:26 AM

## 2024-02-22 ENCOUNTER — Ambulatory Visit: Payer: Self-pay | Admitting: Advanced Practice Midwife

## 2024-02-22 LAB — CERVICOVAGINAL ANCILLARY ONLY
Bacterial Vaginitis (gardnerella): NEGATIVE
Candida Glabrata: NEGATIVE
Candida Vaginitis: NEGATIVE
Comment: NEGATIVE
Comment: NEGATIVE
Comment: NEGATIVE

## 2024-02-24 ENCOUNTER — Encounter: Admitting: Certified Nurse Midwife

## 2024-02-24 ENCOUNTER — Other Ambulatory Visit

## 2024-02-24 DIAGNOSIS — O402XX1 Polyhydramnios, second trimester, fetus 1: Secondary | ICD-10-CM

## 2024-02-24 DIAGNOSIS — O0990 Supervision of high risk pregnancy, unspecified, unspecified trimester: Secondary | ICD-10-CM

## 2024-02-24 DIAGNOSIS — Z3A34 34 weeks gestation of pregnancy: Secondary | ICD-10-CM

## 2024-02-25 ENCOUNTER — Ambulatory Visit (HOSPITAL_BASED_OUTPATIENT_CLINIC_OR_DEPARTMENT_OTHER): Admitting: Obstetrics and Gynecology

## 2024-02-25 ENCOUNTER — Other Ambulatory Visit: Payer: Self-pay

## 2024-02-25 ENCOUNTER — Observation Stay
Admission: EM | Admit: 2024-02-25 | Discharge: 2024-02-25 | Disposition: A | Discharge status: Home / Self Care | Attending: Certified Nurse Midwife | Admitting: Certified Nurse Midwife

## 2024-02-25 ENCOUNTER — Encounter: Payer: Self-pay | Admitting: Obstetrics

## 2024-02-25 ENCOUNTER — Ambulatory Visit

## 2024-02-25 ENCOUNTER — Ambulatory Visit: Attending: Maternal & Fetal Medicine | Admitting: *Deleted

## 2024-02-25 VITALS — BP 124/83 | HR 94

## 2024-02-25 DIAGNOSIS — O402XX1 Polyhydramnios, second trimester, fetus 1: Secondary | ICD-10-CM

## 2024-02-25 DIAGNOSIS — O358XX0 Maternal care for other (suspected) fetal abnormality and damage, not applicable or unspecified: Secondary | ICD-10-CM

## 2024-02-25 DIAGNOSIS — N898 Other specified noninflammatory disorders of vagina: Secondary | ICD-10-CM | POA: Diagnosis not present

## 2024-02-25 DIAGNOSIS — O0990 Supervision of high risk pregnancy, unspecified, unspecified trimester: Principal | ICD-10-CM

## 2024-02-25 DIAGNOSIS — O26893 Other specified pregnancy related conditions, third trimester: Secondary | ICD-10-CM | POA: Diagnosis not present

## 2024-02-25 DIAGNOSIS — Z3A34 34 weeks gestation of pregnancy: Secondary | ICD-10-CM | POA: Insufficient documentation

## 2024-02-25 DIAGNOSIS — O4703 False labor before 37 completed weeks of gestation, third trimester: Secondary | ICD-10-CM | POA: Diagnosis present

## 2024-02-25 DIAGNOSIS — O99891 Other specified diseases and conditions complicating pregnancy: Secondary | ICD-10-CM | POA: Diagnosis not present

## 2024-02-25 DIAGNOSIS — R102 Pelvic and perineal pain: Secondary | ICD-10-CM | POA: Diagnosis not present

## 2024-02-25 DIAGNOSIS — O368330 Maternal care for abnormalities of the fetal heart rate or rhythm, third trimester, not applicable or unspecified: Secondary | ICD-10-CM | POA: Diagnosis not present

## 2024-02-25 DIAGNOSIS — O403XX1 Polyhydramnios, third trimester, fetus 1: Secondary | ICD-10-CM | POA: Diagnosis not present

## 2024-02-25 LAB — WET PREP, GENITAL
Clue Cells Wet Prep HPF POC: NONE SEEN
Sperm: NONE SEEN
Trich, Wet Prep: NONE SEEN
WBC, Wet Prep HPF POC: >=10 — AB (ref <10)
Yeast Wet Prep HPF POC: NONE SEEN

## 2024-02-25 MED ORDER — LACTATED RINGERS IV BOLUS: 1000.0000 mL | Freq: Once | INTRAVENOUS | Status: DC

## 2024-02-25 MED ORDER — NIFEDIPINE ER OSMOTIC RELEASE 30 MG PO TB24
30.0000 mg | ORAL_TABLET | Freq: Once | ORAL | Status: AC
  Administered 2024-02-25: 30 mg via ORAL
  Filled 2024-02-25: qty 1

## 2024-02-25 NOTE — Progress Notes (Signed)
 Maternal-Fetal Medicine Consultation Name: Brandi [name] MRN: [id number]  G1 P0 at 34w 6d gestation. Monochorionic twins with Twin reversed arterial Perfusion or TRAP. Patient is here for NST. She was evaluated at the [facility]U to rule out labor.  No cervical dilation was seen.  Modified BPP NST is reactive. I performed a bedside ultrasound. Amniotic fluid is normal and good fetal heart activity is seen. AFI is 16 cm. Cephalic presentation. No evidence of polyhydramnios.  Acardiac twin was not assessed. I reassured the patient of the findings. Reassuring Modified BPP is associated with a very low likelihood of stillbirth (0.8 per 1,000 births in one week). I counseled the patient on the findings and the resolution of polyhydramnios.  I discussed and recommended delivery at [redacted] weeks gestation.  Recommendations - Continue weekly antenatal testing until delivery. - Delivery at [redacted] weeks gestation.  Consultation including face-to-face (more than 50%) counseling 10 minutes.

## 2024-02-25 NOTE — OB Triage Note (Signed)
 Discharge instructions, labor precautions, and follow-up care reviewed with patient. All questions answered. Patient verbalized understanding. Discharged ambulatory off unit.

## 2024-02-25 NOTE — Procedures (Signed)
 Brandi [name] [birth year] [redacted]w[redacted]d  Fetus A Non-Stress Test Interpretation for 02/25/24  Indication: Polyhydramnios  Fetal Heart Rate A Mode: External Baseline Rate (A): 150 bpm Variability: Moderate Accelerations: 15 x 15 Decelerations: Variable Multiple birth?: No  Uterine Activity Mode: Palpation, Toco Contraction Frequency (min): 3 ucs with ui Contraction Duration (sec): 60 Contraction Quality: Mild Resting Tone Palpated: Relaxed Resting Time: Adequate  Interpretation (Fetal Testing) Nonstress Test Interpretation: Reactive Comments: Dr. Arna reviewed tracing

## 2024-02-25 NOTE — OB Triage Note (Signed)
 LABOR & DELIVERY OB TRIAGE NOTE  SUBJECTIVE  HPI Brandi [name] is a 23 y.o. G1P0 at [redacted]w[redacted]d who presents to Labor & Delivery for increasing vaginal pressure and contractions. She has been feeling this pressure and contractions for a few weeks but it got worse on Saturday. She denies vaginal bleeding or LOF. Endorses good fetal movement.  Her pregnancy has been complicated by acardiac twin that has now been almost completely absorb per her last MFM note.   OB History     Gravida  1   Para      Term      Preterm      AB      Living         SAB      IAB      Ectopic      Multiple      Live Births              Scheduled Meds:  NIFEdipine   30 mg Oral Once   Continuous Infusions:  lactated ringers        OBJECTIVE  BP 124/78   Temp 98.2 F (36.8 C) (Oral)   LMP 06/26/2023 (Exact Date)   General: A & O x 3 Lungs: normal work of breathing Abdomen: non-tender, gravid. Moderate on palpation during a contraction Cervical exam:   0.5/thick/-3, unchanged after 4 hours  NST I reviewed the NST and it was reactive.  Baseline: 140 Variability: moderate Accelerations: 15 x 15 Decelerations:none Toco: 2-4 Category 1  ASSESSMENT Impression  1) Pregnancy at G1P0, [redacted]w[redacted]d, Estimated Date of Delivery: 04/01/24 2) Reassuring maternal/fetal status 3) IV bolus and procardia  given. Wet prep negative 4)no cervical change in triage after 4 hours.   PLAN 1) Reviewed that she would need to be reevaluated with worsening contraction pain, vaginal bleeding or water leaking. Advised to go home and sleep and rest today with limited activity. Keep routine ROB appointment  Damien PARSLEY, CNM  02/25/24  3:33 AM

## 2024-03-03 ENCOUNTER — Ambulatory Visit: Attending: Maternal & Fetal Medicine

## 2024-03-03 ENCOUNTER — Ambulatory Visit: Admitting: *Deleted

## 2024-03-03 VITALS — BP 117/77 | HR 108

## 2024-03-03 DIAGNOSIS — Z3A35 35 weeks gestation of pregnancy: Secondary | ICD-10-CM | POA: Diagnosis not present

## 2024-03-03 DIAGNOSIS — O403XX0 Polyhydramnios, third trimester, not applicable or unspecified: Secondary | ICD-10-CM | POA: Diagnosis not present

## 2024-03-03 DIAGNOSIS — O402XX0 Polyhydramnios, second trimester, not applicable or unspecified: Secondary | ICD-10-CM

## 2024-03-03 DIAGNOSIS — O0990 Supervision of high risk pregnancy, unspecified, unspecified trimester: Secondary | ICD-10-CM

## 2024-03-03 DIAGNOSIS — O402XX1 Polyhydramnios, second trimester, fetus 1: Secondary | ICD-10-CM

## 2024-03-03 NOTE — Procedures (Signed)
 Brandi [name] 2001-02-22 [redacted]w[redacted]d  Fetus A Non-Stress Test Interpretation for 03/03/24  Indication: Polyhydramnios  Fetal Heart Rate A Mode: External Baseline Rate (A): 130 bpm Variability: Moderate Accelerations: 15 x 15 Decelerations: None Multiple birth?: No  Uterine Activity Mode: Palpation, Toco Contraction Frequency (min): none noted Resting Tone Palpated: Relaxed  Interpretation (Fetal Testing) Nonstress Test Interpretation: Reactive Comments: Reviewed with Dr. William

## 2024-03-06 ENCOUNTER — Observation Stay
Admission: EM | Admit: 2024-03-06 | Discharge: 2024-03-06 | Disposition: A | Discharge status: Home / Self Care | Attending: Certified Nurse Midwife | Admitting: Certified Nurse Midwife

## 2024-03-06 ENCOUNTER — Other Ambulatory Visit: Payer: Self-pay

## 2024-03-06 DIAGNOSIS — O368130 Decreased fetal movements, third trimester, not applicable or unspecified: Secondary | ICD-10-CM | POA: Diagnosis not present

## 2024-03-06 DIAGNOSIS — O4703 False labor before 37 completed weeks of gestation, third trimester: Secondary | ICD-10-CM

## 2024-03-06 DIAGNOSIS — O402XX1 Polyhydramnios, second trimester, fetus 1: Secondary | ICD-10-CM

## 2024-03-06 DIAGNOSIS — O368190 Decreased fetal movements, unspecified trimester, not applicable or unspecified: Secondary | ICD-10-CM | POA: Diagnosis present

## 2024-03-06 DIAGNOSIS — Z3A36 36 weeks gestation of pregnancy: Secondary | ICD-10-CM

## 2024-03-06 DIAGNOSIS — O0990 Supervision of high risk pregnancy, unspecified, unspecified trimester: Principal | ICD-10-CM

## 2024-03-06 DIAGNOSIS — O358XX0 Maternal care for other (suspected) fetal abnormality and damage, not applicable or unspecified: Secondary | ICD-10-CM

## 2024-03-06 LAB — URINALYSIS, ROUTINE W REFLEX MICROSCOPIC
Bilirubin Urine: NEGATIVE
Glucose, UA: NEGATIVE mg/dL
Hgb urine dipstick: NEGATIVE
Ketones, ur: NEGATIVE mg/dL
Nitrite: NEGATIVE
Protein, ur: NEGATIVE mg/dL
Specific Gravity, Urine: 1.013 (ref 1.005–1.030)
pH: 5 (ref 5.0–8.0)

## 2024-03-06 LAB — WET PREP, GENITAL
Clue Cells Wet Prep HPF POC: NONE SEEN
Sperm: NONE SEEN
Trich, Wet Prep: NONE SEEN
WBC, Wet Prep HPF POC: <10 (ref <10)
Yeast Wet Prep HPF POC: NONE SEEN

## 2024-03-06 LAB — RUPTURE OF MEMBRANE (ROM)PLUS: Rom Plus: NEGATIVE

## 2024-03-06 NOTE — OB Triage Note (Signed)
 Patient presented to L&D for evaluation stating she's noticed decreased fetal movement since yesterday evening at 1700. Denies vaginal bleeding but states she's been having some gushes of watery fluid since Thursday.

## 2024-03-06 NOTE — Progress Notes (Signed)
 Discharge instructions reviewed with patient who verbalized understanding.  Warning signs reviewed. Patient denies further questions or concerns at this time. Discharged home independently, ambulatory in stable condition.

## 2024-03-06 NOTE — OB Triage Note (Signed)
 LABOR & DELIVERY OB TRIAGE NOTE  SUBJECTIVE  HPI Brandi [name] is a 23 y.o. G1P0 at [redacted]w[redacted]d who presents to Labor & Delivery for decreased fetal movement. Brandi [name] reports feeling decreased fetal movement since yesterday evening. In triage she reports increase in vaginal discharge.   OB History     Gravida  1   Para      Term      Preterm      AB      Living         SAB      IAB      Ectopic      Multiple      Live Births             OBJECTIVE  BP 127/79   Pulse 95   Temp 98.1 F (36.7 C) (Oral)   Ht 5' 9 (1.753 m)   Wt 86.2 kg   LMP 06/26/2023 (Exact Date)   BMI 28.06 kg/m   General: alert and oriented Lungs: normal work of breathing Abdomen: soft, non-tender, gravid Cervical exam: deferred  NST I reviewed the NST and it was reactive.  Baseline: 150 Variability: moderate Accelerations: present Decelerations:none Toco: irritability Category 1  ASSESSMENT Impression  1) Pregnancy at G1P0, [redacted]w[redacted]d, Estimated Date of Delivery: 04/01/24 2) Reassuring maternal/fetal status 3) Wet prep and ROM+ negative 4) Reports fetal movement while in triage  PLAN Discharge home in stable condition. Reviewed when to return precautions, third trimester warning signs.    Saahil Herbster E Jazalyn Mondor, Student-MidWife  03/06/24  2:54 PM

## 2024-03-10 ENCOUNTER — Ambulatory Visit: Admitting: *Deleted

## 2024-03-10 ENCOUNTER — Ambulatory Visit

## 2024-03-10 ENCOUNTER — Ambulatory Visit: Attending: Maternal & Fetal Medicine

## 2024-03-10 VITALS — BP 122/67 | HR 89

## 2024-03-10 DIAGNOSIS — O0990 Supervision of high risk pregnancy, unspecified, unspecified trimester: Secondary | ICD-10-CM

## 2024-03-10 DIAGNOSIS — Z3A36 36 weeks gestation of pregnancy: Secondary | ICD-10-CM | POA: Diagnosis not present

## 2024-03-10 DIAGNOSIS — O402XX1 Polyhydramnios, second trimester, fetus 1: Secondary | ICD-10-CM | POA: Diagnosis not present

## 2024-03-10 DIAGNOSIS — O358XX0 Maternal care for other (suspected) fetal abnormality and damage, not applicable or unspecified: Secondary | ICD-10-CM

## 2024-03-10 DIAGNOSIS — O402XX0 Polyhydramnios, second trimester, not applicable or unspecified: Secondary | ICD-10-CM | POA: Diagnosis not present

## 2024-03-10 DIAGNOSIS — O4703 False labor before 37 completed weeks of gestation, third trimester: Secondary | ICD-10-CM

## 2024-03-10 NOTE — Procedures (Signed)
 Brandi [name] Kursten Kruk 10-12-2000 [redacted]w[redacted]d  Fetus A Non-Stress Test Interpretation for 03/10/24  Indication: Polyhydramnios - NST only  Fetal Heart Rate A Mode: External Baseline Rate (A): 135 bpm Variability: Moderate Accelerations: 15 x 15 Decelerations: None Multiple birth?: No  Uterine Activity Mode: Palpation, Toco Contraction Frequency (min): 5-7 Contraction Duration (sec): 60-70 Contraction Quality: Mild Resting Tone Palpated: Relaxed  Interpretation (Fetal Testing) Nonstress Test Interpretation: Reactive Comments: Reviewed with Dr. Arna

## 2024-03-11 ENCOUNTER — Encounter: Payer: Self-pay | Admitting: Advanced Practice Midwife

## 2024-03-14 ENCOUNTER — Ambulatory Visit: Admitting: Obstetrics

## 2024-03-14 ENCOUNTER — Other Ambulatory Visit (HOSPITAL_COMMUNITY)
Admission: RE | Admit: 2024-03-14 | Discharge: 2024-03-14 | Disposition: A | Discharge status: Home / Self Care | Source: Ambulatory Visit | Attending: Obstetrics | Admitting: Obstetrics

## 2024-03-14 VITALS — BP 110/74 | HR 95 | Wt 197.0 lb

## 2024-03-14 DIAGNOSIS — O3120X0 Continuing pregnancy after intrauterine death of one fetus or more, unspecified trimester, not applicable or unspecified: Secondary | ICD-10-CM

## 2024-03-14 DIAGNOSIS — Z113 Encounter for screening for infections with a predominantly sexual mode of transmission: Secondary | ICD-10-CM | POA: Diagnosis not present

## 2024-03-14 DIAGNOSIS — O30013 Twin pregnancy, monochorionic/monoamniotic, third trimester: Secondary | ICD-10-CM

## 2024-03-14 DIAGNOSIS — O358XX0 Maternal care for other (suspected) fetal abnormality and damage, not applicable or unspecified: Secondary | ICD-10-CM | POA: Diagnosis not present

## 2024-03-14 DIAGNOSIS — Z3A37 37 weeks gestation of pregnancy: Secondary | ICD-10-CM

## 2024-03-14 DIAGNOSIS — Z3685 Encounter for antenatal screening for Streptococcus B: Secondary | ICD-10-CM

## 2024-03-14 NOTE — Patient Instructions (Signed)
 Augmentation of Labor Augmentation of labor is when steps are taken to stimulate and strengthen contractions of the uterus during labor. This may be done when contractions have slowed or stopped, and the progress of labor and delivery of the baby is delayed. Before augmentation of labor begins, these factors will be considered: The mother's medical condition and the baby's condition. The size and position of the baby. The size of the birth canal. Tell a health care provider about: Any allergies you have. All medicines you are taking, including vitamins, herbs, eye drops, creams, and over-the-counter medicines. Any problems you or your family members have had with anesthetic medicines. Any surgeries you have had. Any blood disorders you have. Any medical conditions you have. What are the risks? Generally, this is a safe procedure. However, problems may occur, including: Tearing (rupture) of the uterus. Breaking off (abruption) of the placenta. Increased risk of infection for you and your baby. Increased risk of cesarean, forceps, or vacuum delivery. Excessive bleeding after delivery (postpartum hemorrhage). Umbilical cord prolapse. This can cause the umbilical cord to get squeezed during birth. Too much stimulation of the contractions. This can result in continuous, prolonged, or very strong contractions. Your baby could fail to get enough blood flow or oxygen. This can be life-threatening (fetal death). What happens during the procedure? Augmentation of labor may include: Giving you medicine that stimulates contractions (oxytocin). This is given through an IV that is inserted into a vein in your arm. Breaking the fluid-filled sac that surrounds the fetus (amniotic sac). This procedure is called artificial rupture of membranes. This procedure may vary among health care providers and hospitals. Summary Augmentation of labor is when steps are taken to stimulate and strengthen contractions  of the uterus during labor. This may be done when contractions have slowed down or stopped, and the progress of labor and delivery of the baby is delayed. Labor augmentation may be done using medicine to stimulate contractions (oxytocin). Or it can be done by breaking the fluid-filled sac that surrounds the fetus (amniotic sac). Generally, this is a safe procedure. However, problems can occur. Talk with your health care provider about the potential risks and benefits of labor augmentation if this is offered to you. This information is not intended to replace advice given to you by your health care provider. Make sure you discuss any questions you have with your health care provider. Document Revised: 03/29/2020 Document Reviewed: 03/29/2020 Elsevier Patient Education  2025 [employer]. Signs and Symptoms of Labor Labor is the body's natural process of moving the baby and the placenta out of the uterus. The process of labor usually starts when the baby is full-term, between 38 and 41 weeks of pregnancy. Signs and symptoms that you are close to going into labor As your body prepares for labor and the birth of your baby, you may notice the following symptoms in the weeks and days before true labor starts: Passing a small amount of thick, bloody mucus from your vagina. This is called normal bloody show or losing your mucus plug. This may happen more than a week before labor begins, or right before labor begins, as the opening of the cervix starts to widen (dilate). For some women, the entire mucus plug passes at once. For others, pieces of the mucus plug may gradually pass over several days. Your baby moving (dropping) lower in your pelvis to get into position for birth (lightening). When this happens, you may feel more pressure on your bladder and pelvic  bone and less pressure on your ribs. This may make it easier to breathe. It may also cause you to need to urinate more often and have problems with bowel  movements. Having practice contractions, also called Braxton Hicks contractions or false labor. These occur at irregular (unevenly spaced) intervals that are more than 10 minutes apart. False labor contractions are common after exercise or sexual activity. They will stop if you change position, rest, or drink fluids. These contractions are usually mild and do not get stronger over time. They may feel like: A backache or back pain. Mild cramps, similar to menstrual cramps. Tightening or pressure in your abdomen. Other early symptoms include: Nausea or loss of appetite. Diarrhea. Having a sudden burst of energy, or feeling very tired. Mood changes. Having trouble sleeping. Signs and symptoms that labor has begun Signs that you are in labor may include: Having contractions that come at regular (evenly spaced) intervals and increase in intensity. This may feel like more intense tightening or pressure in your abdomen that moves to your back. Contractions may also feel like rhythmic pain in your upper thighs or back that comes and goes at regular intervals. If you are delivering for the first time, this change in intensity of contractions often occurs at a more gradual pace. If you have given birth before, you may notice a more rapid progression of contraction changes. Feeling pressure in the vaginal area. Your water breaking (rupture of membranes). This is when the sac of fluid that surrounds your baby breaks. Fluid leaking from your vagina may be clear or blood-tinged. Labor usually starts within 24 hours of your water breaking, but it may take longer to begin. Some people may feel a sudden gush of fluid; others may notice repeatedly damp underwear. Follow these instructions at home:  When labor starts, or if your water breaks, call your health care provider or nurse care line. Based on your situation, they will determine when you should go in for an exam. During early labor, you may be able to  rest and manage symptoms at home. Some strategies to try at home include: Breathing and relaxation techniques. Taking a warm bath or shower. Listening to music. Using a heating pad on the lower back for pain. If directed, apply heat to the area as often as told by your health care provider. Use the heat source that your health care provider recommends, such as a moist heat pack or a heating pad. Place a towel between your skin and the heat source. Leave the heat on for 20-30 minutes. Remove the heat if your skin turns bright red. This is especially important if you are unable to feel pain, heat, or cold. You have a greater risk of getting burned. Contact a health care provider if: Your labor has started. Your water breaks. You have nausea, vomiting, or diarrhea. Get help right away if: You have painful, regular contractions that are 5 minutes apart or less. Labor starts before you are [redacted] weeks along in your pregnancy. You have a fever. You have bright red blood coming from your vagina. You do not feel your baby moving. You have a severe headache with or without vision problems. You have chest pain or shortness of breath. These symptoms may represent a serious problem that is an emergency. Do not wait to see if the symptoms will go away. Get medical help right away. Call your local emergency services (911 in the [place] Do not drive yourself to the hospital. Summary  Labor is your body's natural process of moving your baby and the placenta out of your uterus. The process of labor usually starts when your baby is full-term, between 35 and 40 weeks of pregnancy. When labor starts, or if your water breaks, call your health care provider or nurse care line. Based on your situation, they will determine when you should go in for an exam. This information is not intended to replace advice given to you by your health care provider. Make sure you discuss any questions you have with your health care  provider. Document Revised: 10/30/2020 Document Reviewed: 10/30/2020 Elsevier Patient Education  2024 [employer] Nonstress Test: What to Expect A nonstress test, also called an NST, is done during pregnancy to check your baby's heartbeat. The procedure can help to show if your baby is healthy. It may be done if: Your due date has passed. Your pregnancy is high risk. Your baby is moving less than normal. You've lost a previous pregnancy. Your baby is growing slowly. There's too much or too little fluid around your baby. The NST may be done in the third trimester to find out if it's best for your baby to be born early. During an NST, your baby's heartbeat is watched for at least 20 minutes. If the baby is healthy, the heart rate will go up when the baby moves and will return to normal when the baby rests. This should happen at least twice during the test. Tell a health care provider about: Any allergies you have. Any medical problems you have. All medicines you take. These include vitamins, herbs, eye drops, and creams. Any surgeries you've had. Any past pregnancies you've had. What are the risks? There are no risks to you or your baby from a nonstress test. This procedure shouldn't be painful or uncomfortable. What happens before? Eat a meal right before the test or as told by your health care team. Food may help the baby to move. Use the restroom right before the test. What happens during a nonstress test?  Two monitors will be placed on your belly. One will check your baby's heart rate, and the other will check for contractions. You may be asked to lie down on your side or to sit up. You may be given a button to press when you feel your baby move. If your baby seems to be sleeping, you may be asked to drink some juice or soda, eat a snack, or change positions. These steps may vary. Ask what you can expect. What can I expect after? Your team will talk with you about the results  and tell you the next steps. If your team gave you any diet or activity instructions, make sure to follow them. Keep all follow-up visits. This is important to check on your health and the health of your baby. This information is not intended to replace advice given to you by your health care provider. Make sure you discuss any questions you have with your health care provider. Document Revised: 06/11/2023 Document Reviewed: 06/11/2023 Elsevier Patient Education  2025 [employer].

## 2024-03-14 NOTE — Progress Notes (Unsigned)
    Return Prenatal Note   Subjective  23 y.o. G1P0 at [redacted]w[redacted]d presents for this follow-up prenatal visit. 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   Patient has no complaints today. She was seen in triage 9/7 for decreased FM, fetal NST reactive. She missed her last OB visit. Last MFM visit 8/28 ([redacted]w[redacted]d) and bedside US  showed no evidence of polyhydramnios of surviving twin; acardiac twin was not assessed. Delivery recommended at 39wks. She sees them again this Friday, 03/17/24.   Patient reports: Movement: Present Contractions: Irritability Denies vaginal bleeding or leaking fluid. Objective  Flow sheet Vitals: Pulse Rate: 95 BP: 110/74 Total weight gain: 52 lb (23.6 kg)  General Appearance  No acute distress, well appearing, and well nourished Pulmonary   Normal work of breathing Neurologic   Alert and oriented to person, place, and time Psychiatric   Mood and affect within normal limits  Fetus A Non-Stress Test Interpretation for 03/14/24  Indication: TRAP of monochorionic twins   Fetal Heart Rate A Mode: External Baseline Rate (A): 130 bpm Variability: Moderate Accelerations: 15 x 15 Decelerations: None Multiple birth?: No  Uterine Activity Mode: Toco Contraction Frequency (min): None noted  Interpretation (Fetal Testing) Nonstress Test Interpretation: Reactive Overall Impression: Reassuring for gestational age  Assessment/Plan   Plan  23 y.o. G1P0 at [redacted]w[redacted]d presents for follow-up OB visit. Reviewed prenatal record including previous visit note. 1. Supervision of high risk pregnancy in third trimester (Primary) 2. Acardiac twin during pregnancy, antepartum, single or unspecified fetus -GBS and GCCT swabs done today -NST reactive -IOL scheduled for 9/26 at 8a ([redacted]w[redacted]d)  -Labor precautions given -Next MFM visit 9/18  Orders Placed This Encounter   Procedures   Strep Gp B NAA   Fetal nonstress test    Standing Status:   Future    Expiration Date:   03/14/2025   Return in about 1 week (around 03/21/2024) for ROB.   Future Appointments  Date Time Provider Department Center  03/17/2024  1:15 PM [facility] PROVIDER 1 [facility]  03/17/2024  1:30 PM [facility] US3 [facility]  03/22/2024 10:15 AM Leigh Sober, MD [facility] None  03/24/2024  1:00 PM [facility] NURSE [facility]  03/24/2024  1:15 PM [facility] NST [facility]  03/31/2024  1:00 PM [facility] NURSE [facility]  03/31/2024  1:15 PM [facility] NST [facility]    For next visit:  continue with routine prenatal care    Sober Leigh, DO [facility]

## 2024-03-15 ENCOUNTER — Encounter: Payer: Self-pay | Admitting: Obstetrics

## 2024-03-15 DIAGNOSIS — Z3685 Encounter for antenatal screening for Streptococcus B: Secondary | ICD-10-CM | POA: Diagnosis not present

## 2024-03-16 LAB — CERVICOVAGINAL ANCILLARY ONLY
Chlamydia: NEGATIVE
Comment: NEGATIVE
Comment: NORMAL
Neisseria Gonorrhea: NEGATIVE

## 2024-03-17 ENCOUNTER — Ambulatory Visit: Attending: Maternal & Fetal Medicine

## 2024-03-17 ENCOUNTER — Ambulatory Visit

## 2024-03-17 LAB — STREP GP B NAA: Strep Gp B NAA: NEGATIVE

## 2024-03-21 ENCOUNTER — Encounter: Payer: Self-pay | Admitting: Obstetrics & Gynecology

## 2024-03-21 ENCOUNTER — Other Ambulatory Visit: Payer: Self-pay

## 2024-03-21 ENCOUNTER — Encounter: Payer: Self-pay | Admitting: Obstetrics

## 2024-03-21 ENCOUNTER — Inpatient Hospital Stay: Admitting: Anesthesiology

## 2024-03-21 ENCOUNTER — Inpatient Hospital Stay
Admission: EM | Admit: 2024-03-21 | Discharge: 2024-03-23 | DRG: 806 | Disposition: A | Discharge status: Home / Self Care | Attending: Obstetrics & Gynecology | Admitting: Obstetrics & Gynecology

## 2024-03-21 DIAGNOSIS — O4703 False labor before 37 completed weeks of gestation, third trimester: Secondary | ICD-10-CM

## 2024-03-21 DIAGNOSIS — O4202 Full-term premature rupture of membranes, onset of labor within 24 hours of rupture: Secondary | ICD-10-CM | POA: Diagnosis present

## 2024-03-21 DIAGNOSIS — D62 Acute posthemorrhagic anemia: Secondary | ICD-10-CM | POA: Diagnosis not present

## 2024-03-21 DIAGNOSIS — O0990 Supervision of high risk pregnancy, unspecified, unspecified trimester: Principal | ICD-10-CM

## 2024-03-21 DIAGNOSIS — Z3A38 38 weeks gestation of pregnancy: Secondary | ICD-10-CM

## 2024-03-21 DIAGNOSIS — O402XX1 Polyhydramnios, second trimester, fetus 1: Secondary | ICD-10-CM

## 2024-03-21 DIAGNOSIS — O358XX0 Maternal care for other (suspected) fetal abnormality and damage, not applicable or unspecified: Secondary | ICD-10-CM

## 2024-03-21 DIAGNOSIS — O4292 Full-term premature rupture of membranes, unspecified as to length of time between rupture and onset of labor: Principal | ICD-10-CM | POA: Diagnosis present

## 2024-03-21 DIAGNOSIS — O9081 Anemia of the puerperium: Secondary | ICD-10-CM | POA: Diagnosis not present

## 2024-03-21 DIAGNOSIS — Z23 Encounter for immunization: Secondary | ICD-10-CM | POA: Diagnosis not present

## 2024-03-21 LAB — CBC
HCT: 33.1 % — ABNORMAL LOW (ref 36.0–46.0)
Hemoglobin: 11 g/dL — ABNORMAL LOW (ref 12.0–15.0)
MCH: 26.8 pg (ref 26.0–34.0)
MCHC: 33.2 g/dL (ref 30.0–36.0)
MCV: 80.7 fL (ref 80.0–100.0)
Platelets: 238 K/uL (ref 150–400)
RBC: 4.1 MIL/uL (ref 3.87–5.11)
RDW: 13.3 % (ref 11.5–15.5)
WBC: 7.1 K/uL (ref 4.0–10.5)
nRBC: 0 % (ref 0.0–0.2)

## 2024-03-21 LAB — WET PREP, GENITAL
Clue Cells Wet Prep HPF POC: NONE SEEN
Sperm: NONE SEEN
Trich, Wet Prep: NONE SEEN
WBC, Wet Prep HPF POC: <10 (ref <10)
Yeast Wet Prep HPF POC: NONE SEEN

## 2024-03-21 LAB — RUPTURE OF MEMBRANE (ROM)PLUS: Rom Plus: POSITIVE

## 2024-03-21 LAB — TYPE AND SCREEN
ABO/RH(D): B POS
Antibody Screen: NEGATIVE

## 2024-03-21 LAB — ABO/RH: ABO/RH(D): B POS

## 2024-03-21 MED ORDER — SOD CITRATE-CITRIC ACID 500-334 MG/5ML PO SOLN: 30.0000 mL | ORAL | Status: DC | PRN

## 2024-03-21 MED ORDER — LACTATED RINGERS IV SOLN: INTRAVENOUS | Status: DC

## 2024-03-21 MED ORDER — LIDOCAINE HCL (PF) 1 % IJ SOLN
INTRAMUSCULAR | Status: DC | PRN
  Administered 2024-03-21: 3 mL via SUBCUTANEOUS

## 2024-03-21 MED ORDER — SODIUM CHLORIDE 0.9 % IV SOLN
INTRAVENOUS | Status: DC | PRN
  Administered 2024-03-21: 3 mL via EPIDURAL
  Administered 2024-03-21: 5 mL via EPIDURAL

## 2024-03-21 MED ORDER — OXYCODONE-ACETAMINOPHEN 5-325 MG PO TABS: 1.0000 | ORAL_TABLET | ORAL | Status: DC | PRN

## 2024-03-21 MED ORDER — ACETAMINOPHEN 325 MG PO TABS: 650.0000 mg | ORAL_TABLET | ORAL | Status: DC | PRN

## 2024-03-21 MED ORDER — AMMONIA AROMATIC IN INHA
RESPIRATORY_TRACT | Status: AC
  Filled 2024-03-21: qty 10

## 2024-03-21 MED ORDER — ONDANSETRON HCL 4 MG/2ML IJ SOLN: 4.0000 mg | Freq: Four times a day (QID) | INTRAMUSCULAR | Status: DC | PRN

## 2024-03-21 MED ORDER — FENTANYL-BUPIVACAINE-NACL 0.5-0.125-0.9 MG/250ML-% EP SOLN
12.0000 mL/h | EPIDURAL | Status: DC | PRN
  Administered 2024-03-21: 12 mL/h via EPIDURAL

## 2024-03-21 MED ORDER — OXYTOCIN BOLUS FROM INFUSION
333.0000 mL | Freq: Once | INTRAVENOUS | Status: AC
Start: 2024-03-21 — End: 2024-03-22
  Administered 2024-03-22: 333 mL via INTRAVENOUS

## 2024-03-21 MED ORDER — LIDOCAINE-EPINEPHRINE (PF) 1.5 %-1:200000 IJ SOLN
INTRAMUSCULAR | Status: DC | PRN
  Administered 2024-03-21: 3 mL via EPIDURAL

## 2024-03-21 MED ORDER — LIDOCAINE HCL (PF) 1 % IJ SOLN
30.0000 mL | INTRAMUSCULAR | Status: AC | PRN
  Administered 2024-03-22: 30 mL via SUBCUTANEOUS
  Filled 2024-03-21: qty 30

## 2024-03-21 MED ORDER — PHENYLEPHRINE 80 MCG/ML (10ML) SYRINGE FOR IV PUSH (FOR BLOOD PRESSURE SUPPORT): 80.0000 ug | PREFILLED_SYRINGE | INTRAVENOUS | Status: DC | PRN

## 2024-03-21 MED ORDER — MISOPROSTOL 200 MCG PO TABS
ORAL_TABLET | ORAL | Status: AC
  Filled 2024-03-21: qty 4

## 2024-03-21 MED ORDER — FENTANYL-BUPIVACAINE-NACL 0.5-0.125-0.9 MG/250ML-% EP SOLN
EPIDURAL | Status: AC
  Filled 2024-03-21: qty 250

## 2024-03-21 MED ORDER — OXYTOCIN-SODIUM CHLORIDE 30-0.9 UT/500ML-% IV SOLN
2.5000 [IU]/h | INTRAVENOUS | Status: DC
Start: 2024-03-21 — End: 2024-03-23

## 2024-03-21 MED ORDER — EPHEDRINE 5 MG/ML INJ: 10.0000 mg | INTRAVENOUS | Status: DC | PRN

## 2024-03-21 MED ORDER — LACTATED RINGERS IV SOLN: 500.0000 mL | INTRAVENOUS | Status: DC | PRN

## 2024-03-21 MED ORDER — OXYCODONE-ACETAMINOPHEN 5-325 MG PO TABS: 2.0000 | ORAL_TABLET | ORAL | Status: DC | PRN

## 2024-03-21 MED ORDER — LACTATED RINGERS IV SOLN
500.0000 mL | Freq: Once | INTRAVENOUS | Status: AC
  Administered 2024-03-21: 500 mL via INTRAVENOUS

## 2024-03-21 MED ORDER — OXYTOCIN 10 UNIT/ML IJ SOLN
INTRAMUSCULAR | Status: AC
  Filled 2024-03-21: qty 2

## 2024-03-21 MED ORDER — DIPHENHYDRAMINE HCL 50 MG/ML IJ SOLN: 12.5000 mg | INTRAMUSCULAR | Status: DC | PRN

## 2024-03-21 NOTE — Anesthesia Preprocedure Evaluation (Signed)
Anesthesia Evaluation  Patient identified by MRN, date of birth, ID band Patient awake    Reviewed: Allergy & Precautions, H&P , NPO status , Patient's Chart, lab work & pertinent test results, reviewed documented beta blocker date and time   Airway Mallampati: II  TM Distance: >3 FB Neck ROM: full    Dental no notable dental hx. (+) Teeth Intact   Pulmonary neg pulmonary ROS   Pulmonary exam normal breath sounds clear to auscultation       Cardiovascular Exercise Tolerance: Good negative cardio ROS  Rhythm:regular Rate:Normal     Neuro/Psych negative neurological ROS  negative psych ROS   GI/Hepatic negative GI ROS, Neg liver ROS,,,  Endo/Other  negative endocrine ROSdiabetes, Well Controlled, Gestational    Renal/GU      Musculoskeletal   Abdominal   Peds  Hematology negative hematology ROS (+)   Anesthesia Other Findings   Reproductive/Obstetrics (+) Pregnancy                             Anesthesia Physical Anesthesia Plan  ASA: 2  Anesthesia Plan: Epidural   Post-op Pain Management:    Induction:   PONV Risk Score and Plan:   Airway Management Planned:   Additional Equipment:   Intra-op Plan:   Post-operative Plan:   Informed Consent: I have reviewed the patients History and Physical, chart, labs and discussed the procedure including the risks, benefits and alternatives for the proposed anesthesia with the patient or authorized representative who has indicated his/her understanding and acceptance.       Plan Discussed with:   Anesthesia Plan Comments:        Anesthesia Quick Evaluation

## 2024-03-21 NOTE — Anesthesia Procedure Notes (Signed)
 Epidural Patient location during procedure: OB End time: 03/21/2024 11:08 PM  Staffing Performed: anesthesiologist   Preanesthetic Checklist Completed: patient identified, IV checked, site marked, risks and benefits discussed, surgical consent, monitors and equipment checked, pre-op evaluation and timeout performed  Epidural Patient position: sitting Prep: Betadine Patient monitoring: heart rate, continuous pulse ox and blood pressure Approach: midline Location: L4-L5 Injection technique: LOR saline  Needle:  Needle type: Tuohy  Needle gauge: 17 G Needle length: 9 cm and 9 Needle insertion depth: 5 cm Catheter type: closed end flexible Catheter size: 19 Gauge Catheter at skin depth: 12 cm Test dose: negative and 1.5% lidocaine  with Epi 1:200 K  Assessment Sensory level: T10 Events: blood not aspirated, no cerebrospinal fluid, injection not painful, no injection resistance, no paresthesia and negative IV test  Additional Notes   Patient tolerated the insertion well without complications.-SATD -IVTD. No paresthesia. Refer to [facility] nursing for VS and dosingReason for block:procedure for pain

## 2024-03-21 NOTE — H&P (Signed)
 Subjective:  Brandi [name] is a 23 y.o. G1 P0 at 38.[redacted] weeks gestation who is being admitted for ROM at about 8 am.  Her current obstetrical history is significant for loss of twin during this pregnancy.  Patient reports pelvic pressure for the last 2 days.   Fetal Movement: normal. She was not aware but she had a + HSV 2 igg in 2023. She denies any h/o outbreaks.     Objective:   Vital signs in last 24 hours: Temp:  [98.4 F (36.9 C)] 98.4 F (36.9 C) (09/22 1236) Pulse Rate:  [96] 96 (09/22 1236) BP: (126)/(78) 126/78 (09/22 1236) Weight:  [90.7 kg] 90.7 kg (09/22 1236)   General:   alert  Skin:   normal  HEENT:  PERRLA  Lungs:   clear to auscultation bilaterally  Heart:   regular rate and rhythm, S1, S2 normal, no murmur, click, rub or gallop  Breasts:     Abdomen:  Benign, gravid, EFW less than 8 pounds  Pelvis:  I did a thorough exam of her vulva and did not see any HSV outbreak, only normal findings.  FHT:  120 BPM, category 1     Presentations: cephalic  Cervix:  2 07/01[birth year]68/-2 per CNMW    GBS negative     Assessment/Plan:  Term, ROM, latent labor I will offer augmentation once she has been admitted.

## 2024-03-21 NOTE — OB Triage Note (Signed)
 Patient presented to L&D for evaluation with complaints of leaking clear fluid that started this morning at 0830. Denies vaginal bleeding or decreased fetal movement.

## 2024-03-21 NOTE — Progress Notes (Signed)
 S. She feels the contractions but they are not painful.  O. VSS, AF FHR- 140s, category 1 CVX- 4/90/-3/vertex I felt a forebag so I did an AROM. A large amount of clear fluid was noted. CTX q 4 minutes  A/P. Term labor, SROM

## 2024-03-22 ENCOUNTER — Encounter: Payer: Self-pay | Admitting: Obstetrics & Gynecology

## 2024-03-22 ENCOUNTER — Encounter: Admitting: Obstetrics

## 2024-03-22 DIAGNOSIS — Z3A38 38 weeks gestation of pregnancy: Secondary | ICD-10-CM

## 2024-03-22 DIAGNOSIS — O4202 Full-term premature rupture of membranes, onset of labor within 24 hours of rupture: Secondary | ICD-10-CM

## 2024-03-22 LAB — RPR: RPR Ser Ql: NONREACTIVE

## 2024-03-22 MED ORDER — BENZOCAINE-MENTHOL 20-0.5 % EX AERO
1.0000 | INHALATION_SPRAY | CUTANEOUS | Status: DC | PRN
  Administered 2024-03-22: 1 via TOPICAL
  Filled 2024-03-22: qty 56

## 2024-03-22 MED ORDER — TETANUS-DIPHTH-ACELL PERTUSSIS 5-2.5-18.5 LF-MCG/0.5 IM SUSY: 0.5000 mL | PREFILLED_SYRINGE | Freq: Once | INTRAMUSCULAR | Status: DC

## 2024-03-22 MED ORDER — COCONUT OIL OIL: 1.0000 | TOPICAL_OIL | Status: DC | PRN

## 2024-03-22 MED ORDER — DIPHENHYDRAMINE HCL 25 MG PO CAPS: 25.0000 mg | ORAL_CAPSULE | Freq: Four times a day (QID) | ORAL | Status: DC | PRN

## 2024-03-22 MED ORDER — ZOLPIDEM TARTRATE 5 MG PO TABS: 5.0000 mg | ORAL_TABLET | Freq: Every evening | ORAL | Status: DC | PRN

## 2024-03-22 MED ORDER — SODIUM CHLORIDE 0.9% FLUSH
3.0000 mL | Freq: Two times a day (BID) | INTRAVENOUS | Status: DC
  Administered 2024-03-22: 3 mL via INTRAVENOUS

## 2024-03-22 MED ORDER — SIMETHICONE 80 MG PO CHEW: 80.0000 mg | CHEWABLE_TABLET | ORAL | Status: DC | PRN

## 2024-03-22 MED ORDER — PRENATAL MULTIVITAMIN CH
1.0000 | ORAL_TABLET | Freq: Every day | ORAL | Status: DC
  Administered 2024-03-22 – 2024-03-23 (×2): 1 via ORAL
  Filled 2024-03-22 (×2): qty 1

## 2024-03-22 MED ORDER — SODIUM CHLORIDE 0.9% FLUSH: 3.0000 mL | INTRAVENOUS | Status: DC | PRN

## 2024-03-22 MED ORDER — ONDANSETRON HCL 4 MG/2ML IJ SOLN: 4.0000 mg | INTRAMUSCULAR | Status: DC | PRN

## 2024-03-22 MED ORDER — SENNOSIDES-DOCUSATE SODIUM 8.6-50 MG PO TABS
2.0000 | ORAL_TABLET | ORAL | Status: DC
  Administered 2024-03-22 – 2024-03-23 (×2): 2 via ORAL
  Filled 2024-03-22 (×2): qty 2

## 2024-03-22 MED ORDER — IBUPROFEN 600 MG PO TABS
600.0000 mg | ORAL_TABLET | Freq: Four times a day (QID) | ORAL | Status: DC
  Administered 2024-03-22 – 2024-03-23 (×4): 600 mg via ORAL
  Filled 2024-03-22 (×4): qty 1

## 2024-03-22 MED ORDER — ONDANSETRON HCL 4 MG PO TABS: 4.0000 mg | ORAL_TABLET | ORAL | Status: DC | PRN

## 2024-03-22 MED ORDER — ACETAMINOPHEN 325 MG PO TABS: 650.0000 mg | ORAL_TABLET | ORAL | Status: AC | PRN

## 2024-03-22 MED ORDER — SODIUM CHLORIDE 0.9 % IV SOLN: INTRAVENOUS | Status: AC | PRN

## 2024-03-22 MED ORDER — DIBUCAINE (PERIANAL) 1 % EX OINT
1.0000 | TOPICAL_OINTMENT | CUTANEOUS | Status: DC | PRN
  Administered 2024-03-22: 1 via RECTAL
  Filled 2024-03-22: qty 28

## 2024-03-22 MED ORDER — ACETAMINOPHEN 325 MG PO TABS: 650.0000 mg | ORAL_TABLET | ORAL | Status: DC | PRN

## 2024-03-22 MED ORDER — WITCH HAZEL-GLYCERIN EX PADS
1.0000 | MEDICATED_PAD | CUTANEOUS | Status: DC | PRN
  Administered 2024-03-22: 1 via TOPICAL
  Filled 2024-03-22: qty 100

## 2024-03-22 MED ORDER — IBUPROFEN 600 MG PO TABS: 600.0000 mg | ORAL_TABLET | Freq: Four times a day (QID) | ORAL | 0 refills | Status: AC

## 2024-03-22 NOTE — Lactation Note (Signed)
 This note was copied from a baby's chart. Lactation Consultation Note  Patient Name: Brandi [name] Date: 03/22/2024 Age:21 hours Reason for consult: Initial assessment;Primapara;Early term 37-38.6wks;Breastfeeding assistance;Maternal discharge;Other (Comment) (LGA baby)   Maternal Data This is mom's 1st baby, SVD. Baby is LGA.  On initial assessment requested by care nurse to assist mother with breastfeeding. Per nurse baby with BS level of 40 will need glucose gel per protocol. Upon entering the room baby is very spitting. And gagging on clear secretion. Instructed mother on use of bulb syringe. Provided update to care nurse who also noted baby has been spitty. Baby suctioned by Dr.Coyner and 16 ml's was obtained. Baby would not breastfeed nor take formula supplement. Post Blood sugar check by care nurse done and was 55. Per care nurse/Dr.Coyner baby to rest for now and to feed again on cue and within 3 hours. LC assisted mom with pumping as baby would not feed. Mom expressed a few drops which was given to baby on gloved finger. Has patient been taught Hand Expression?: Yes Does the patient have breastfeeding experience prior to this delivery?: No  Feeding Mother's Current Feeding Choice: Breast Milk and Formula Provided tips and strategies for maximizing positioning and latch techniques. Baby did not feed at this time.  LATCH Score Latch: Too sleepy or reluctant, no latch achieved, no sucking elicited. (latches briefly and detaches, no sucking elicited.)  Audible Swallowing: None  Type of Nipple: Everted at rest and after stimulation  Comfort (Breast/Nipple): Soft / non-tender  Hold (Positioning): Assistance needed to correctly position infant at breast and maintain latch.  LATCH Score: 5   Lactation Tools Discussed/Used Tools: Pump Breast pump type: Double-Electric Breast Pump Pump Education: Setup, frequency, and cleaning;Milk Storage Reason for Pumping: Baby with  glucose of 40 and is not sustaining the latch or sucking. Pumping frequency: recommended mom pump if baby does not latch and feed Pumped volume:  (a few dropd of colostrum)  Interventions Interventions: Breast feeding basics reviewed;Assisted with latch;Breast massage;Support pillows;Adjust position;Education  Discharge  Mom has a n electric personal use pump. Reviewed hand pump that comes with DEBP kit to use at home as needed.  Consult Status Consult Status: Follow-up Date: 03/22/24 Follow-up type: In-patient  Update provided to care nurse.  [name] 03/22/2024, 4:32 PM

## 2024-03-23 LAB — CBC
HCT: 25.6 % — ABNORMAL LOW (ref 36.0–46.0)
Hemoglobin: 8.7 g/dL — ABNORMAL LOW (ref 12.0–15.0)
MCH: 27.3 pg (ref 26.0–34.0)
MCHC: 34 g/dL (ref 30.0–36.0)
MCV: 80.3 fL (ref 80.0–100.0)
Platelets: 192 K/uL (ref 150–400)
RBC: 3.19 MIL/uL — ABNORMAL LOW (ref 3.87–5.11)
RDW: 13.4 % (ref 11.5–15.5)
WBC: 9.6 K/uL (ref 4.0–10.5)
nRBC: 0 % (ref 0.0–0.2)

## 2024-03-23 MED ORDER — IBUPROFEN 600 MG PO TABS
600.0000 mg | ORAL_TABLET | Freq: Four times a day (QID) | ORAL | Status: DC
Start: 2024-03-23 — End: 2024-03-23
  Administered 2024-03-23: 600 mg via ORAL
  Filled 2024-03-23: qty 1

## 2024-03-23 NOTE — Discharge Instructions (Signed)

## 2024-03-23 NOTE — Anesthesia Postprocedure Evaluation (Signed)
 Anesthesia Post Note  Patient: Brandi [name]  Procedure(s) Performed: AN AD HOC LABOR EPIDURAL  Patient location during evaluation: Mother Baby Anesthesia Type: Epidural Level of consciousness: awake and alert Pain management: pain level controlled Vital Signs Assessment: post-procedure vital signs reviewed and stable Respiratory status: spontaneous breathing, nonlabored ventilation and respiratory function stable Cardiovascular status: stable Postop Assessment: no headache, no backache and epidural receding Anesthetic complications: no   No notable events documented.   Last Vitals:  Vitals:   03/22/24 1927 03/22/24 2324  BP: 108/74 121/68  Pulse: 90 89  Resp: 18 18  Temp: 36.8 C 36.8 C  SpO2: 93% 97%    Last Pain:  Vitals:   03/22/24 2324  TempSrc: Oral  PainSc:                  Alfrieda LILLETTE Lan

## 2024-03-23 NOTE — Lactation Note (Signed)
 This note was copied from a baby's chart. Lactation Consultation Note  Patient Name: Girl Lance Galas Unijb'd Date: 03/23/2024 Age:23 Reason for consult: Follow-up assessment;Primapara;Early term 37-38.6wks;Other (Comment) (Discharge Education)   Maternal Data Follow up assessment and discharge education w/ 29hr old baby girl and parents.  Mom stated that she offers breast and infant just latches to nipple and goes to sleep.  Mom asked lactation is it ok to just do formula.  Feeding Mother's Current Feeding Choice: Breast Milk and Formula  Infant just completed a feeding prior to entry into the room.  Interventions Interventions: Breast feeding basics reviewed;Education;Pace feeding  LC provided parents with different examples of how to feed infant and how it doesn't limit them to just putting infant to the breast.  During this time, LC provided education on stimulating the breast, pumping, combo feeding and pace feeding.  Discharge Discharge Education: Engorgement and breast care;Outpatient recommendation;Warning signs for feeding baby  Education on engorgement prevention/treatment was discussed as well as breastmilk storage guidelines.  LC provided patient with a handout on breastmilk storage guidelines from [facility]. [facility] outpatient lactation services phone number written on the white board in the room.  Patient verbalized understanding.  LC also provided education from the postpartum book about warning signs to look for in a poor feeding.    Consult Status Consult Status: Complete Follow-up type: Call as needed    Ricky GORMAN Range 03/23/2024, 12:38 PM

## 2024-03-23 NOTE — Discharge Summary (Signed)
 Postpartum Discharge Summary  Date of Service updated 03/23/24     Patient Name: Brandi [name] DOB: 2000-07-15 MRN: [id number]  Date of admission: 03/21/2024 Delivery date:03/22/2024 Delivering provider: STARLA HARLAND BROCKS Date of discharge: 03/23/2024  Admitting diagnosis: Labor and delivery, indication for care [O75.9] Intrauterine pregnancy: [redacted]w[redacted]d     Secondary diagnosis:  Active Problems:   Labor and delivery, indication for care  Additional problems: N/A    Discharge diagnosis: Term Pregnancy Delivered                                              Post partum procedures:n/a Augmentation: Pitocin  Complications: None  Hospital course: Onset of Labor With Vaginal Delivery      23 y.o. yo G1P1001 at [redacted]w[redacted]d was admitted in Latent Labor on 03/21/2024. Labor course was complicated by   Membrane Rupture Time/Date: 8:30 AM,03/21/2024  Delivery Method:Vaginal, Spontaneous Operative Delivery:N/A Episiotomy: None Lacerations:  1st degree;Perineal Patient had a postpartum course complicated by n/a.  She is ambulating, tolerating a regular diet, passing flatus, and urinating well. Patient reports her mood is good, she has lots of support at home. Breastfeeding well, some nipple soreness, is also supplementing with formula. States she is feeling a little queasy when standing, offered IV iron and she declined. Encouraged PO iron at home. Patient is discharged home in stable condition on 03/23/24.  Newborn Data: Birth date:03/22/2024 Birth time:5:21 AM Gender:Female Living status:Living Apgars:8 ,9  Weight:4290 g  Magnesium Sulfate received: No BMZ received: No Rhophylac:N/A MMR:N/A T-DaP:Given prenatally Flu: No RSV Vaccine received: No Transfusion:No Immunizations administered: Immunization History  Administered Date(s) Administered   HPV 9-valent 02/13/2016, 12/10/2016   Hepatitis A 06/09/2005   Hepatitis B 04-24-03, 06/22/2003, 04/19/2004   Influenza-Unspecified  03/31/2023   PPD Test 06/17/2022, 06/26/2022   Tdap 02/13/2016, 01/19/2024    Physical exam  Vitals:   03/22/24 1705 03/22/24 1927 03/22/24 2324 03/23/24 0911  BP: 125/89 108/74 121/68 104/72  Pulse: 75 90 89 77  Resp: 20 18 18 19   Temp: 98.3 F (36.8 C) 98.2 F (36.8 C) 98.2 F (36.8 C) 98.1 F (36.7 C)  TempSrc: Oral Oral Oral Oral  SpO2: 100% 93% 97% 100%  Weight:      Height:       General: alert and cooperative Lochia: appropriate Breasts: Soft non-tender, nipples intact Uterine Fundus: firm Incision: N/A Perineum: Approximated, no swelling DVT Evaluation: No evidence of DVT seen on physical exam. Negative Homan's sign. No cords or calf tenderness. Labs: Lab Results  Component Value Date   WBC 9.6 03/23/2024   HGB 8.7 (L) 03/23/2024   HCT 25.6 (L) 03/23/2024   MCV 80.3 03/23/2024   PLT 192 03/23/2024      Latest Ref Rng & Units 01/21/2023    3:13 PM  CMP  Glucose 70 - 99 mg/dL 88   BUN 6 - 20 mg/dL 9   Creatinine 9.42 - 8.99 mg/dL 9.26   Sodium 865 - 855 mmol/L 138   Potassium 3.5 - 5.2 mmol/L 4.1   Chloride 96 - 106 mmol/L 103   CO2 20 - 29 mmol/L 20   Calcium 8.7 - 10.2 mg/dL 9.5   Total Protein 6.0 - 8.5 g/dL 6.9   Total Bilirubin 0.0 - 1.2 mg/dL 1.4   Alkaline Phos 42 - 106 IU/L 64   AST 0 -  40 IU/L 17   ALT 0 - 32 IU/L 12    Edinburgh Score:    03/23/2024   12:00 PM  Edinburgh Postnatal Depression Scale Screening Tool  I have been able to laugh and see the funny side of things. 0  I have looked forward with enjoyment to things. 0  I have blamed myself unnecessarily when things went wrong. 2  I have been anxious or worried for no good reason. 1  I have felt scared or panicky for no good reason. 1  Things have been getting on top of me. 1  I have been so unhappy that I have had difficulty sleeping. 0  I have felt sad or miserable. 0  I have been so unhappy that I have been crying. 0  The thought of harming myself has occurred to me. 0   Edinburgh Postnatal Depression Scale Total 5      After visit meds:  Allergies as of 03/23/2024       Reactions   Plum Pulp Hives   Oral reaction    Apple Juice    Patient states she is allergic to any apple products        Medication List     TAKE these medications    acetaminophen  325 MG tablet Commonly known as: Tylenol  Take 2 tablets (650 mg total) by mouth every 4 (four) hours as needed (for pain scale < 4).   ibuprofen  600 MG tablet Commonly known as: ADVIL  Take 1 tablet (600 mg total) by mouth every 6 (six) hours.   multivitamin-prenatal 27-0.8 MG Tabs tablet Take 1 tablet by mouth daily at 12 noon.               Discharge Care Instructions  (From admission, onward)           Start     Ordered   03/23/24 0000  Discharge wound care:       Comments: SHOWER DAILY Wash incision gently with soap and water.  Call office with any drainage, redness, or firmness of the incision.   03/23/24 1235             Discharge home in stable condition Infant Feeding: Bottle and Breast Infant Disposition:home with mother Discharge instruction: per After Visit Summary and Postpartum booklet. Activity: Advance as tolerated. Pelvic rest for 6 weeks.  Diet: routine diet Anticipated Birth Control: Condoms Postpartum Appointment:6 weeks, 2 week virtual mood check Additional Postpartum F/U: n/a Future Appointments: Future Appointments  Date Time Provider Department Center  03/24/2024  1:00 PM [facility] NURSE [facility]  03/24/2024  1:15 PM [facility]  03/31/2024  1:00 PM [facility] NURSE [facility]  03/31/2024  1:15 PM [facility]   Follow up Visit:      03/23/2024 JINNIE HERO Leeanna Slaby, CNM

## 2024-03-23 NOTE — Progress Notes (Signed)
 Patient discharged. Discharge instructions given. Patient verbalizes understanding. Transported by axillary.

## 2024-03-24 ENCOUNTER — Ambulatory Visit

## 2024-03-31 ENCOUNTER — Ambulatory Visit

## 2024-04-01 ENCOUNTER — Inpatient Hospital Stay: Admit: 2024-04-01 | Payer: Medicaid Other

## 2024-05-02 NOTE — Progress Notes (Unsigned)
   SUBJECTIVE  Jennifr Myeisha Marilou Barnfield is a 23 y.o. G1P1001 who gave birth to a fe/female infant at Unknown weeks via   NSVD/VAVD/C/S on *** with /anesthesia.  She is {Breast or [phone]}. She is here today for a PP follow-up visit.    ROS  {Ros; complete female:19594}  Mood    Bowel function: Bladder function: Vaginal bleeding: Pain:  Denies difficulty breathing, chest pain, lower extremity pain or swelling, excessive vaginal bleeding, vaginal pain.   Infant:  OBJECTIVE  There is no height or weight on file to calculate BMI.  There were no vitals taken for this visit. Plum pulp and Apple juice  Past Medical/Surgical History Past Medical History:  Diagnosis Date   Transverse myelitis (HCC)    Past Surgical History:  Procedure Laterality Date   HERNIA REPAIR      Last Pap: {findings; last pap:13140}  {Exam; complete female:17926}  ASSESSMENT  PLAN  The pregnancy intention screening data noted above was reviewed. Potential methods of contraception were discussed. The patient elected to proceed with No data recorded.  Discussed PP mood changes and coping strategies. Reviewed preventive care (dental, eye exam, vaccines, routine screenings). Follow up in 6 months for annual exam or PRN.  Rollo JINNY Maxin, CMA

## 2024-05-03 ENCOUNTER — Ambulatory Visit (INDEPENDENT_AMBULATORY_CARE_PROVIDER_SITE_OTHER): Admitting: Certified Nurse Midwife

## 2024-05-03 ENCOUNTER — Encounter: Payer: Self-pay | Admitting: Certified Nurse Midwife

## 2024-05-03 DIAGNOSIS — Z862 Personal history of diseases of the blood and blood-forming organs and certain disorders involving the immune mechanism: Secondary | ICD-10-CM

## 2024-05-03 DIAGNOSIS — Z1332 Encounter for screening for maternal depression: Secondary | ICD-10-CM

## 2024-05-03 DIAGNOSIS — Z3009 Encounter for other general counseling and advice on contraception: Secondary | ICD-10-CM

## 2024-05-03 DIAGNOSIS — O99345 Other mental disorders complicating the puerperium: Secondary | ICD-10-CM

## 2024-05-03 LAB — POCT HEMOGLOBIN: Hemoglobin: 11.5 g/dL (ref 11–14.6)

## 2024-05-03 MED ORDER — NORELGESTROMIN-ETH ESTRADIOL 150-35 MCG/24HR TD PTWK: 1.0000 | MEDICATED_PATCH | TRANSDERMAL | 4 refills | Status: AC

## 2024-05-31 ENCOUNTER — Ambulatory Visit: Admitting: Certified Nurse Midwife
